# Patient Record
Sex: Female | Born: 1995 | Race: Black or African American | Hispanic: No | Marital: Single | Smoking: Never smoker
Health system: Southern US, Community
[De-identification: ages and names within clinical notes are randomized; demographics above are authoritative.]

## PROBLEM LIST (undated history)

## (undated) ENCOUNTER — Inpatient Hospital Stay (HOSPITAL_COMMUNITY): Payer: Self-pay

## (undated) DIAGNOSIS — R87629 Unspecified abnormal cytological findings in specimens from vagina: Secondary | ICD-10-CM

## (undated) DIAGNOSIS — F419 Anxiety disorder, unspecified: Secondary | ICD-10-CM

## (undated) DIAGNOSIS — N189 Chronic kidney disease, unspecified: Secondary | ICD-10-CM

## (undated) DIAGNOSIS — Z789 Other specified health status: Secondary | ICD-10-CM

## (undated) DIAGNOSIS — R0600 Dyspnea, unspecified: Secondary | ICD-10-CM

## (undated) DIAGNOSIS — E119 Type 2 diabetes mellitus without complications: Secondary | ICD-10-CM

## (undated) HISTORY — DX: Unspecified abnormal cytological findings in specimens from vagina: R87.629

## (undated) HISTORY — DX: Chronic kidney disease, unspecified: N18.9

## (undated) HISTORY — DX: Dyspnea, unspecified: R06.00

## (undated) HISTORY — PX: NO PAST SURGERIES: SHX2092

---
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
## 2015-11-13 ENCOUNTER — Encounter (HOSPITAL_COMMUNITY): Payer: Self-pay | Admitting: Emergency Medicine

## 2015-11-13 ENCOUNTER — Emergency Department (HOSPITAL_COMMUNITY)
Admission: EM | Admit: 2015-11-13 | Discharge: 2015-11-13 | Disposition: A | Discharge status: Home / Self Care | Payer: 59 | Attending: Emergency Medicine | Admitting: Emergency Medicine

## 2015-11-13 DIAGNOSIS — O2341 Unspecified infection of urinary tract in pregnancy, first trimester: Secondary | ICD-10-CM | POA: Insufficient documentation

## 2015-11-13 DIAGNOSIS — N390 Urinary tract infection, site not specified: Secondary | ICD-10-CM

## 2015-11-13 DIAGNOSIS — Z3A00 Weeks of gestation of pregnancy not specified: Secondary | ICD-10-CM | POA: Diagnosis not present

## 2015-11-13 DIAGNOSIS — Z3490 Encounter for supervision of normal pregnancy, unspecified, unspecified trimester: Secondary | ICD-10-CM

## 2015-11-13 DIAGNOSIS — R102 Pelvic and perineal pain: Secondary | ICD-10-CM | POA: Diagnosis present

## 2015-11-13 LAB — URINE MICROSCOPIC-ADD ON

## 2015-11-13 LAB — POC URINE PREG, ED: PREG TEST UR: POSITIVE — AB

## 2015-11-13 LAB — URINALYSIS, ROUTINE W REFLEX MICROSCOPIC
BILIRUBIN URINE: NEGATIVE
Glucose, UA: NEGATIVE mg/dL
Hgb urine dipstick: NEGATIVE
Ketones, ur: 40 mg/dL — AB
NITRITE: NEGATIVE
Protein, ur: NEGATIVE mg/dL
SPECIFIC GRAVITY, URINE: 1.022 (ref 1.005–1.030)
pH: 6.5 (ref 5.0–8.0)

## 2015-11-13 LAB — I-STAT CHEM 8, ED
BUN: 5 mg/dL — AB (ref 6–20)
CALCIUM ION: 1.14 mmol/L — AB (ref 1.15–1.40)
Chloride: 105 mmol/L (ref 101–111)
Creatinine, Ser: 0.7 mg/dL (ref 0.44–1.00)
Glucose, Bld: 79 mg/dL (ref 65–99)
HEMATOCRIT: 40 % (ref 36.0–46.0)
Hemoglobin: 13.6 g/dL (ref 12.0–15.0)
Potassium: 3.4 mmol/L — ABNORMAL LOW (ref 3.5–5.1)
SODIUM: 139 mmol/L (ref 135–145)
TCO2: 21 mmol/L (ref 0–100)

## 2015-11-13 MED ORDER — NITROFURANTOIN MONOHYD MACRO 100 MG PO CAPS: 100.0000 mg | ORAL_CAPSULE | Freq: Two times a day (BID) | ORAL | 0 refills | Status: DC

## 2015-11-13 MED ORDER — PRENATAL COMPLETE 14-0.4 MG PO TABS: 1.0000 | ORAL_TABLET | Freq: Two times a day (BID) | ORAL | 2 refills | Status: DC

## 2015-11-13 NOTE — ED Notes (Signed)
Patient was alert, oriented and stable upon discharge. RN went over AVS and patient had no further questions.  

## 2015-11-13 NOTE — ED Triage Notes (Signed)
Pt c/o bilateral foot and hand pain and swelling since this morning. Pt denies injury. Pt sts she did recently move and is having to go up and down stairs more. Pt girlfriend also sts "I've noticed her vagina has been swollen for about two weeks now." Pt c/o mild vaginal pain. A&Ox4 and ambulatory. Denies chest pain, SOB, dizziness.

## 2015-11-13 NOTE — ED Provider Notes (Signed)
[facility]-EMERGENCY DEPT Provider Note   CSN: [id number] Arrival date & time: 11/13/15  1536  By signing my name below, I, Vista Minkobert Ross, attest that this documentation has been prepared under the direction and in the presence of Thomas Mabry PA-C.  Electronically Signed: Vista Minkobert Ross, ED Scribe. 11/13/15. 9:00 PM.   History   Chief Complaint Chief Complaint  Patient presents with  . Foot Swelling  . hand swelling  . Groin Swelling    HPI HPI Comments: Yesenia Dawson is a 20 y.o. female who presents to the Emergency Department complaining of pain and swelling to bilateral lower extremities and bilateral hands, onset this morning. Pt states that she recently started a new job and has been on her feet a lot. She has no Hx of similar pain and swelling. Pt denies any recent injury. She also complains of mild vaginal pain and suprapubic abdominal discomfort. Pt requests a pregnancy test. She also declines pelvic exam. She denies any fever, cough, vomiting, diarrhea, vaginal discharge.   The history is provided by the patient. No language interpreter was used.    History reviewed. No pertinent past medical history.  There are no active problems to display for this patient.   History reviewed. No pertinent surgical history.  OB History    No data available       Home Medications    Prior to Admission medications   Medication Sig Start Date End Date Taking? Authorizing Provider  nitrofurantoin, macrocrystal-monohydrate, (MACROBID) 100 MG capsule Take 1 capsule (100 mg total) by mouth 2 (two) times daily. 11/13/15   Marlon Peliffany Kandee Escalante, PA-C  Prenatal Vit-Fe Fumarate-FA (PRENATAL COMPLETE) 14-0.4 MG TABS Take 1 tablet by mouth 2 (two) times daily. 11/13/15   Marlon Peliffany Ersel Enslin, PA-C    Family History Family History  Problem Relation Age of Onset  . Hypertension Mother   . Hypertension Father   . Cancer Other   . Diabetes Other     Social History Social History  Substance Use Topics    . Smoking status: Never Smoker  . Smokeless tobacco: Never Used  . Alcohol use Yes     Allergies   Review of patient's allergies indicates no known allergies.   Review of Systems Review of Systems  Constitutional: Negative for fever.  Respiratory: Negative for cough.   Gastrointestinal: Positive for abdominal pain (suprapubic). Negative for diarrhea, nausea and vomiting.  Genitourinary: Negative for vaginal discharge.  Musculoskeletal: Positive for arthralgias (bilateral upper and lower extremities).  All other systems reviewed and are negative.    Physical Exam Updated Vital Signs BP 119/79   Pulse 89   Temp 98.2 F (36.8 C) (Oral)   Resp 16   LMP 09/26/2015   SpO2 99%   Physical Exam  Constitutional: She is oriented to person, place, and time. She appears well-developed and well-nourished. No distress.  HENT:  Head: Normocephalic and atraumatic.  Neck: Normal range of motion.  Pulmonary/Chest: Effort normal and breath sounds normal. She has no wheezes. She has no rales.  Abdominal: She exhibits no distension and no mass. There is tenderness. There is no rebound and no guarding. No hernia.  Mild suprapubic tenderness  Genitourinary:  Genitourinary Comments: Pt declined pelvic exam.  Musculoskeletal:  No objective signs of swelling to bilateral upper and lower extremities.  Neurological: She is alert and oriented to person, place, and time.  Skin: Skin is warm and dry. She is not diaphoretic.  Psychiatric: She has a normal mood and affect. Judgment normal.  Nursing note and vitals reviewed.   ED Treatments / Results  DIAGNOSTIC STUDIES: Oxygen Saturation is 99% on RA, normal by my interpretation.  COORDINATION OF CARE: 8:57 PM-Will order blood work. Discussed treatment plan with pt at bedside and pt agreed to plan.   Labs (all labs ordered are listed, but only abnormal results are displayed) Labs Reviewed  URINALYSIS, ROUTINE W REFLEX MICROSCOPIC (NOT AT  [facility] - Abnormal; Notable for the following:       Result Value   APPearance CLOUDY (*)    Ketones, ur 40 (*)    Leukocytes, UA MODERATE (*)    All other components within normal limits  URINE MICROSCOPIC-ADD ON - Abnormal; Notable for the following:    Squamous Epithelial / LPF 6-30 (*)    Bacteria, UA FEW (*)    All other components within normal limits  I-STAT CHEM 8, ED - Abnormal; Notable for the following:    Potassium 3.4 (*)    BUN 5 (*)    Calcium, Ion 1.14 (*)    All other components within normal limits  POC URINE PREG, ED - Abnormal; Notable for the following:    Preg Test, Ur POSITIVE (*)    All other components within normal limits  URINE CULTURE    EKG  EKG Interpretation None       Radiology No results found.  Procedures Procedures (including critical care time)  Medications Ordered in ED Medications - No data to display   Initial Impression / Assessment and Plan / ED Course  I have reviewed the triage vital signs and the nursing notes.  Pertinent labs & imaging results that were available during my care of the patient were reviewed by me and considered in my medical decision making (see chart for details).  Clinical Course    Patient has positive pregnancy test, based on missing only one menstrual cycle she is likely early in the pregnancy. Will start her on prenatals and refer to womens. UTI, urine culture sent out and started on Macrobid.  Medications - No data to display  I discussed results, diagnoses and plan with Yesenia Dawson. They voice there understanding and questions were answered. We discussed follow-up recommendations and return precautions.   Final Clinical Impressions(s) / ED Diagnoses   Final diagnoses:  Pregnant  UTI (lower urinary tract infection)    New Prescriptions New Prescriptions   NITROFURANTOIN, MACROCRYSTAL-MONOHYDRATE, (MACROBID) 100 MG CAPSULE    Take 1 capsule (100 mg total) by mouth 2 (two) times daily.    PRENATAL VIT-FE FUMARATE-FA (PRENATAL COMPLETE) 14-0.4 MG TABS    Take 1 tablet by mouth 2 (two) times daily.    I personally performed the services described in this documentation, which was scribed in my presence. The recorded information has been reviewed and is accurate.    [name], PA-C 11/13/15 4782    Mancel Bale, MD 11/14/15 [phone]

## 2015-11-15 LAB — URINE CULTURE

## 2015-12-13 DIAGNOSIS — Z3490 Encounter for supervision of normal pregnancy, unspecified, unspecified trimester: Secondary | ICD-10-CM | POA: Insufficient documentation

## 2015-12-13 DIAGNOSIS — Z148 Genetic carrier of other disease: Secondary | ICD-10-CM | POA: Insufficient documentation

## 2015-12-13 LAB — OB RESULTS CONSOLE HIV ANTIBODY (ROUTINE TESTING): HIV: NONREACTIVE

## 2015-12-13 LAB — OB RESULTS CONSOLE ABO/RH: RH Type: POSITIVE

## 2015-12-13 LAB — OB RESULTS CONSOLE RUBELLA ANTIBODY, IGM: RUBELLA: IMMUNE

## 2015-12-13 LAB — OB RESULTS CONSOLE GC/CHLAMYDIA
Chlamydia: POSITIVE
GC PROBE AMP, GENITAL: NEGATIVE

## 2015-12-13 LAB — OB RESULTS CONSOLE HEPATITIS B SURFACE ANTIGEN: HEP B S AG: NEGATIVE

## 2015-12-13 LAB — OB RESULTS CONSOLE RPR: RPR: NONREACTIVE

## 2015-12-13 LAB — OB RESULTS CONSOLE ANTIBODY SCREEN: Antibody Screen: NEGATIVE

## 2016-01-10 DIAGNOSIS — O99210 Obesity complicating pregnancy, unspecified trimester: Secondary | ICD-10-CM | POA: Insufficient documentation

## 2016-02-26 NOTE — L&D Delivery Note (Signed)
Delivery Note At 12:02 PM a viable female was delivered via Vaginal, Spontaneous Delivery (Presentation: vtx; ROP ).  APGAR: pending; weight pending.   Placenta status: spontaneous, intact, clots at edges.  Cord:  with the following complications: none.  Cord pH: pending  Anesthesia:  Epidural Episiotomy: None Lacerations: None Suture Repair: none Est. Blood Loss (mL):  300  Mom to postpartum.  Baby to NICU.  Wendal Wilkie D 05/08/2016, 12:15 PM

## 2016-04-07 ENCOUNTER — Emergency Department (HOSPITAL_COMMUNITY)
Admission: EM | Admit: 2016-04-07 | Discharge: 2016-04-07 | Disposition: A | Discharge status: Home / Self Care | Payer: 59 | Attending: Emergency Medicine | Admitting: Emergency Medicine

## 2016-04-07 ENCOUNTER — Emergency Department (HOSPITAL_COMMUNITY): Payer: 59

## 2016-04-07 ENCOUNTER — Encounter (HOSPITAL_COMMUNITY): Payer: Self-pay | Admitting: *Deleted

## 2016-04-07 DIAGNOSIS — Y929 Unspecified place or not applicable: Secondary | ICD-10-CM | POA: Insufficient documentation

## 2016-04-07 DIAGNOSIS — O9A212 Injury, poisoning and certain other consequences of external causes complicating pregnancy, second trimester: Secondary | ICD-10-CM | POA: Insufficient documentation

## 2016-04-07 DIAGNOSIS — Z79899 Other long term (current) drug therapy: Secondary | ICD-10-CM | POA: Diagnosis not present

## 2016-04-07 DIAGNOSIS — S8392XA Sprain of unspecified site of left knee, initial encounter: Secondary | ICD-10-CM | POA: Insufficient documentation

## 2016-04-07 DIAGNOSIS — Z3A25 25 weeks gestation of pregnancy: Secondary | ICD-10-CM | POA: Insufficient documentation

## 2016-04-07 DIAGNOSIS — Y939 Activity, unspecified: Secondary | ICD-10-CM | POA: Diagnosis not present

## 2016-04-07 DIAGNOSIS — Y999 Unspecified external cause status: Secondary | ICD-10-CM | POA: Diagnosis not present

## 2016-04-07 DIAGNOSIS — W010XXA Fall on same level from slipping, tripping and stumbling without subsequent striking against object, initial encounter: Secondary | ICD-10-CM | POA: Insufficient documentation

## 2016-04-07 MED ORDER — ACETAMINOPHEN 500 MG PO TABS
1000.0000 mg | ORAL_TABLET | Freq: Once | ORAL | Status: AC
  Administered 2016-04-07: 1000 mg via ORAL
  Filled 2016-04-07: qty 2

## 2016-04-07 MED ORDER — CALCIUM CARBONATE ANTACID 500 MG PO CHEW
2.0000 | CHEWABLE_TABLET | Freq: Once | ORAL | Status: AC
  Administered 2016-04-07: 400 mg via ORAL
  Filled 2016-04-07: qty 2

## 2016-04-07 NOTE — ED Notes (Signed)
Bed: [id number] Expected date:  Expected time:  Means of arrival:  Comments: tri1

## 2016-04-07 NOTE — ED Provider Notes (Signed)
[facility]EMERGENCY DEPT Provider Note   CSN: [id number] Arrival date & time: 04/07/16 1837     History    Chief Complaint  Patient presents with  . Fall     HPI Summar Yesenia Dawson is a 21 y.o. female.  21yo F [redacted] weeks pregnant who p/w L knee Pain after a fall. 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 She felt a pop sensation in her left knee and had an immediate onset of moderate to severe, constant left knee pain. She has been able to walk but with a limp. No abdominal trauma or pain, vaginal bleeding/fluid, or other complaints.   History reviewed. No pertinent past medical history.   There are no active problems to display for this patient.   History reviewed. No pertinent surgical history.  OB History    Gravida Para Term Preterm AB Living   1             SAB TAB Ectopic Multiple Live Births                    Home Medications    Prior to Admission medications   Medication Sig Start Date End Date Taking? Authorizing Provider  Prenatal Vit-Fe Fumarate-FA (PRENATAL COMPLETE) 14-0.4 MG TABS Take 1 tablet by mouth 2 (two) times daily. 11/13/15  Yes Tiffany Neva Seat, PA-C  nitrofurantoin, macrocrystal-monohydrate, (MACROBID) 100 MG capsule Take 1 capsule (100 mg total) by mouth 2 (two) times daily. Patient not taking: Reported on 04/07/2016 11/13/15   [name], PA-C      Family History  Problem Relation Age of Onset  . Hypertension Mother   . Hypertension Father   . Cancer Other   . Diabetes Other      Social History  Substance Use Topics  . Smoking status: Never Smoker  . Smokeless tobacco: Never Used  . Alcohol use Yes     Allergies     Patient has no known allergies.    Review of Systems  10 Systems reviewed and are negative for acute change except as noted in the HPI.   Physical Exam Updated Vital Signs BP 132/86 (BP Location: Left Arm)   Pulse 98   Temp 98.4 F  (36.9 C)   Resp 18   Ht 5' (1.524 m)   Wt 230 lb (104.3 kg)   LMP 09/26/2015   SpO2 100%   BMI 44.92 kg/m   Physical Exam  Constitutional: She is oriented to person, place, and time. She appears well-developed and well-nourished. No distress.  HENT:  Head: Normocephalic and atraumatic.  Eyes: Conjunctivae are normal.  Neck: Neck supple.  Cardiovascular: Intact distal pulses.   Musculoskeletal: Normal range of motion. She exhibits tenderness.  Tenderness of anterior L knee over patella w/ no obvious deformity; no joint laxity, negative ant/post drawer sign; pain w/ flexion of knee  Neurological: She is alert and oriented to person, place, and time. No sensory deficit.  Skin: Skin is warm and dry.  Psychiatric: She has a normal mood and affect. Judgment normal.  Nursing note and vitals reviewed.     ED Treatments / Results  Labs (all labs ordered are listed, but only abnormal results are displayed) Labs Reviewed - No data to display   EKG  EKG Interpretation  Date/Time:    Ventricular Rate:    PR Interval:    QRS Duration:   QT Interval:  QTC Calculation:   R Axis:     Text Interpretation:           Radiology Dg Knee Complete 4 Views Left  Result Date: 04/07/2016 CLINICAL DATA:  Slip and fall with left knee pain, initial encounter EXAM: LEFT KNEE - COMPLETE 4+ VIEW COMPARISON:  None. FINDINGS: No evidence of fracture, dislocation, or joint effusion. No evidence of arthropathy or other focal bone abnormality. Soft tissues are unremarkable. IMPRESSION: No acute abnormality noted. Electronically Signed   By: Alcide CleverMark  Lukens M.D.   On: 04/07/2016 19:58    Procedures Procedures (including critical care time) Procedures  Medications Ordered in ED  Medications  acetaminophen (TYLENOL) tablet 1,000 mg (1,000 mg Oral Given 04/07/16 2013)  calcium carbonate (TUMS - dosed in mg elemental calcium) chewable tablet 400 mg of elemental calcium (400 mg of elemental calcium  Oral Given 04/07/16 2029)     Initial Impression / Assessment and Plan / ED Course  I have reviewed the triage vital signs and the nursing notes.  Pertinent imaging results that were available during my care of the patient were reviewed by me and considered in my medical decision making (see chart for details).      Pt w/ L knee pain after fall. No joint laxity on exam, neurovascularly intact. Plain films negative acute. She does report pain with ambulation and hearing a pop, therefore it is possible she may have ligamentous injury. Placed in the immobilizer with crutches and provided with orthopedics follow-up information. She has no pregnancy related complaints therefore stable for discharge. Discussed supportive care including ice, elevation, and Tylenol only. Patient voiced understanding was discharged in satisfactory condition.  Final Clinical Impressions(s) / ED Diagnoses   Final diagnoses:  Sprain of left knee, unspecified ligament, initial encounter     Discharge Medication List as of 04/07/2016  8:13 PM         Laurence Spatesachel Morgan Kashia Brossard, MD 04/08/16 [phone]

## 2016-04-07 NOTE — ED Notes (Signed)
Patient is 6 months pregnant. Denies any abdominal pain or potential harm to baby boy. Denies any feelings of contractions.

## 2016-04-07 NOTE — ED Triage Notes (Signed)
Pt arrives via EMS with c/o fall when she slipped and fell on the floor, landed onto left side. C/o left knee pain. Denies further pain. 138/92 bp

## 2016-04-07 NOTE — ED Notes (Signed)
Patient transported to X-ray 

## 2016-04-20 ENCOUNTER — Encounter (HOSPITAL_COMMUNITY): Payer: Self-pay

## 2016-04-20 ENCOUNTER — Inpatient Hospital Stay (HOSPITAL_COMMUNITY)
Admission: AD | Admit: 2016-04-20 | Discharge: 2016-04-20 | Disposition: A | Discharge status: Home / Self Care | Payer: 59 | Source: Ambulatory Visit | Attending: Obstetrics and Gynecology | Admitting: Obstetrics and Gynecology

## 2016-04-20 DIAGNOSIS — O26812 Pregnancy related exhaustion and fatigue, second trimester: Secondary | ICD-10-CM

## 2016-04-20 DIAGNOSIS — Z3A26 26 weeks gestation of pregnancy: Secondary | ICD-10-CM | POA: Diagnosis not present

## 2016-04-20 DIAGNOSIS — O26892 Other specified pregnancy related conditions, second trimester: Secondary | ICD-10-CM | POA: Diagnosis not present

## 2016-04-20 DIAGNOSIS — O9989 Other specified diseases and conditions complicating pregnancy, childbirth and the puerperium: Secondary | ICD-10-CM | POA: Diagnosis not present

## 2016-04-20 DIAGNOSIS — Z711 Person with feared health complaint in whom no diagnosis is made: Secondary | ICD-10-CM

## 2016-04-20 DIAGNOSIS — R8271 Bacteriuria: Secondary | ICD-10-CM | POA: Diagnosis not present

## 2016-04-20 DIAGNOSIS — O26813 Pregnancy related exhaustion and fatigue, third trimester: Secondary | ICD-10-CM

## 2016-04-20 DIAGNOSIS — O99891 Other specified diseases and conditions complicating pregnancy: Secondary | ICD-10-CM

## 2016-04-20 LAB — CBC
HEMATOCRIT: 33.7 % — AB (ref 36.0–46.0)
HEMOGLOBIN: 11.7 g/dL — AB (ref 12.0–15.0)
MCH: 29.3 pg (ref 26.0–34.0)
MCHC: 34.7 g/dL (ref 30.0–36.0)
MCV: 84.3 fL (ref 78.0–100.0)
Platelets: 276 10*3/uL (ref 150–400)
RBC: 4 MIL/uL (ref 3.87–5.11)
RDW: 14.3 % (ref 11.5–15.5)
WBC: 13.8 10*3/uL — ABNORMAL HIGH (ref 4.0–10.5)

## 2016-04-20 LAB — URINALYSIS, ROUTINE W REFLEX MICROSCOPIC
Bilirubin Urine: NEGATIVE
GLUCOSE, UA: NEGATIVE mg/dL
Hgb urine dipstick: NEGATIVE
KETONES UR: NEGATIVE mg/dL
Nitrite: NEGATIVE
PH: 7 (ref 5.0–8.0)
Protein, ur: NEGATIVE mg/dL
Specific Gravity, Urine: 1.017 (ref 1.005–1.030)

## 2016-04-20 MED ORDER — NITROFURANTOIN MONOHYD MACRO 100 MG PO CAPS: 100.0000 mg | ORAL_CAPSULE | Freq: Two times a day (BID) | ORAL | 0 refills | Status: DC

## 2016-04-20 NOTE — Discharge Instructions (Signed)
Pregnancy and Urinary Tract Infection °WHAT IS A URINARY TRACT INFECTION? °A urinary tract infection (UTI) is an infection of any part of the urinary tract. This includes the kidneys, the tubes that connect your kidneys to your bladder (ureters), the bladder, and the tube that carries urine out of your body (urethra). These organs make, store, and get rid of urine in the body. A UTI can be a bladder infection (cystitis) or a kidney infection (pyelonephritis). This infection may be caused by fungi, viruses, and bacteria. Bacteria are the most common cause of UTIs. °You are more likely to develop a UTI during pregnancy because: °· The physical and hormonal changes your body goes through can make it easier for bacteria to get into your urinary tract. °· Your growing baby puts pressure on your uterus and can affect urine flow. °DOES A UTI PLACE MY BABY AT RISK? °An untreated UTI during pregnancy could lead to a kidney infection, which can cause health problems that could affect your baby. Possible complications of an untreated UTI include: °· Having your baby before 37 weeks of pregnancy (premature). °· Having a baby with a low birth weight. °· Developing high blood pressure during pregnancy (preeclampsia). °WHAT ARE THE SYMPTOMS OF A UTI? °Symptoms of a UTI include: °· Fever. °· Frequent urination or passing small amounts of urine frequently. °· Needing to urinate urgently. °· Pain or a burning sensation with urination. °· Urine that smells bad or unusual. °· Cloudy urine. °· Pain in the lower abdomen or back. °· Trouble urinating. °· Blood in the urine. °· Vomiting or being less hungry than normal. °· Diarrhea or abdominal pain. °· Vaginal discharge. °WHAT ARE THE TREATMENT OPTIONS FOR A UTI DURING PREGNANCY? °Treatment for this condition may include: °· Antibiotic medicines that are safe to take during pregnancy. °· Other medicines to treat less common causes of UTI. °HOW CAN I PREVENT A UTI? °To prevent a UTI: °· Go  to the bathroom as soon as you feel the need. °· Always wipe from front to back. °· Wash your genital area with soap and warm water daily. °· Empty your bladder before and after sex. °· Wear cotton underwear. °· Limit your intake of high sugar foods or drinks, such as regular soda, juice, and sweets.. °· Drink 6-8 glasses of water daily. °· Do not wear tight-fitting pants. °· Do not douche or use deodorant sprays. °· Do not drink alcohol, caffeine, or carbonated drinks. These can irritate the bladder. °WHEN SHOULD I SEEK MEDICAL CARE? °Seek medical care if: °· Your symptoms do not improve or get worse. °· You have a fever after two days of treatment. °· You have a rash. °· You have abnormal vaginal discharge. °· You have back or side pain. °· You have chills. °· You have nausea and vomiting. °WHEN SHOULD I SEEK IMMEDIATE MEDICAL CARE? °Seek immediate medical care if you are pregnant and: °· You feel contractions in your uterus. °· You have lower belly pain. °· You have a gush of fluid from your vagina. °· You have blood in your urine. °· You are vomiting and cannot keep down any medicines or water. °This information is not intended to replace advice given to you by your health care provider. Make sure you discuss any questions you have with your health care provider. °Document Released: 06/08/2010 Document Revised: 07/17/2015 Document Reviewed: 01/02/2015 °Elsevier Interactive Patient Education © 2017 [employer] ° °

## 2016-04-20 NOTE — MAU Note (Signed)
Pt has been feeling weak and out of breath. No bleeding or LOF.

## 2016-04-20 NOTE — MAU Provider Note (Signed)
History     CSN: [id number]  Arrival date and time: 04/20/16 1818   First Provider Initiated Contact with Patient 04/20/16 1857      Chief Complaint  Patient presents with  . Fatigue   HPI   Yesenia Dawson is 21 y.o. female G1P0 @ 4023w6d female here in MAU with fatigue.  She was upset with her boss today at work. 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 The patient got upset and started crying. When she gets upset she starts breathing really fast and feels weak after. She thinks she is becoming anxious at times.   OB History    Gravida Para Term Preterm AB Living   1             SAB TAB Ectopic Multiple Live Births                  No past medical history on file.  No past surgical history on file.  Family History  Problem Relation Age of Onset  . Hypertension Mother   . Hypertension Father   . Cancer Other   . Diabetes Other     Social History  Substance Use Topics  . Smoking status: Never Smoker  . Smokeless tobacco: Never Used  . Alcohol use Yes    Allergies: No Known Allergies  Prescriptions Prior to Admission  Medication Sig Dispense Refill Last Dose  . nitrofurantoin, macrocrystal-monohydrate, (MACROBID) 100 MG capsule Take 1 capsule (100 mg total) by mouth 2 (two) times daily. (Patient not taking: Reported on 04/07/2016) 10 capsule 0 Completed Course at Unknown time  . Prenatal Vit-Fe Fumarate-FA (PRENATAL COMPLETE) 14-0.4 MG TABS Take 1 tablet by mouth 2 (two) times daily. 60 each 2 04/07/2016 at Unknown time   Results for orders placed or performed during the hospital encounter of 04/20/16 (from the past 48 hour(s))  Urinalysis, Routine w reflex microscopic     Status: Abnormal   Collection Time: 04/20/16  6:18 PM  Result Value Ref Range   Color, Urine YELLOW YELLOW   APPearance HAZY (A) CLEAR   Specific Gravity, Urine 1.017 1.005 - 1.030   pH 7.0 5.0 -  8.0   Glucose, UA NEGATIVE NEGATIVE mg/dL   Hgb urine dipstick NEGATIVE NEGATIVE   Bilirubin Urine NEGATIVE NEGATIVE   Ketones, ur NEGATIVE NEGATIVE mg/dL   Protein, ur NEGATIVE NEGATIVE mg/dL   Nitrite NEGATIVE NEGATIVE   Leukocytes, UA MODERATE (A) NEGATIVE   RBC / HPF 6-30 0 - 5 RBC/hpf   WBC, UA TOO NUMEROUS TO COUNT 0 - 5 WBC/hpf   Bacteria, UA RARE (A) NONE SEEN   Squamous Epithelial / LPF 0-5 (A) NONE SEEN   Mucous PRESENT   CBC     Status: Abnormal   Collection Time: 04/20/16  7:07 PM  Result Value Ref Range   WBC 13.8 (H) 4.0 - 10.5 K/uL   RBC 4.00 3.87 - 5.11 MIL/uL   Hemoglobin 11.7 (L) 12.0 - 15.0 g/dL   HCT 57.833.7 (L) 46.936.0 - 62.946.0 %   MCV 84.3 78.0 - 100.0 fL   MCH 29.3 26.0 - 34.0 pg   MCHC 34.7 30.0 - 36.0 g/dL   RDW 52.814.3 41.311.5 - 24.415.5 %   Platelets 276 150 - 400 K/uL   Review of Systems  Constitutional: Positive for fatigue.  Genitourinary: Negative for dysuria.  Musculoskeletal: Negative for  back pain.  Neurological: Positive for weakness.   Physical Exam   Blood pressure 119/86, pulse 118, temperature 98.7 F (37.1 C), resp. rate 18, last menstrual period 09/26/2015, SpO2 100 %.  Physical Exam  Constitutional: She is oriented to person, place, and time. She appears well-developed and well-nourished. No distress.  HENT:  Head: Normocephalic.  Eyes: Pupils are equal, round, and reactive to light.  Cardiovascular: Normal rate and normal heart sounds.   Respiratory: Effort normal and breath sounds normal.  Musculoskeletal: Normal range of motion.  Neurological: She is alert and oriented to person, place, and time.  Skin: Skin is warm. She is not diaphoretic.  Psychiatric: Her behavior is normal.   Fetal Tracing: Baseline: 140 bpm Variability: Moderate  Accelerations: 10x10 Decelerations: none Toco: none  MAU Course  Procedures  None  MDM  CBC UA Urine culture NST Discussed patient with Dr. Senaida Ores. Patient is stable at this time. Ok for  discharge.   Assessment and Plan   A:  1. Physically well but worried   2. Fatigue during pregnancy in second trimester   3. Asymptomatic bacteriuria during pregnancy     P:  Discharge home in stable condition Rx: Macrobid Return to MAU for emergencies only Follow up with OB Discussed breathing techniques, avoid triggers.   Duane Lope, NP 04/20/2016 8:01 PM

## 2016-04-22 LAB — CULTURE, OB URINE

## 2016-05-05 ENCOUNTER — Inpatient Hospital Stay (HOSPITAL_COMMUNITY): Payer: 59

## 2016-05-05 ENCOUNTER — Inpatient Hospital Stay (HOSPITAL_COMMUNITY)
Admission: AD | Admit: 2016-05-05 | Discharge: 2016-05-10 | DRG: 774 | Disposition: A | Discharge status: Home / Self Care | Payer: 59 | Source: Ambulatory Visit | Attending: Obstetrics and Gynecology | Admitting: Obstetrics and Gynecology

## 2016-05-05 ENCOUNTER — Encounter (HOSPITAL_COMMUNITY): Payer: Self-pay

## 2016-05-05 DIAGNOSIS — Z6841 Body Mass Index (BMI) 40.0 and over, adult: Secondary | ICD-10-CM

## 2016-05-05 DIAGNOSIS — O26873 Cervical shortening, third trimester: Secondary | ICD-10-CM | POA: Diagnosis present

## 2016-05-05 DIAGNOSIS — O42013 Preterm premature rupture of membranes, onset of labor within 24 hours of rupture, third trimester: Principal | ICD-10-CM | POA: Diagnosis present

## 2016-05-05 DIAGNOSIS — Z3A29 29 weeks gestation of pregnancy: Secondary | ICD-10-CM

## 2016-05-05 DIAGNOSIS — Z8249 Family history of ischemic heart disease and other diseases of the circulatory system: Secondary | ICD-10-CM

## 2016-05-05 DIAGNOSIS — O4703 False labor before 37 completed weeks of gestation, third trimester: Secondary | ICD-10-CM

## 2016-05-05 DIAGNOSIS — O99214 Obesity complicating childbirth: Secondary | ICD-10-CM | POA: Diagnosis present

## 2016-05-05 DIAGNOSIS — B9689 Other specified bacterial agents as the cause of diseases classified elsewhere: Secondary | ICD-10-CM | POA: Diagnosis present

## 2016-05-05 DIAGNOSIS — Z833 Family history of diabetes mellitus: Secondary | ICD-10-CM

## 2016-05-05 HISTORY — DX: Other specified health status: Z78.9

## 2016-05-05 LAB — CBC
HEMATOCRIT: 38.1 % (ref 36.0–46.0)
HEMOGLOBIN: 12.9 g/dL (ref 12.0–15.0)
MCH: 29.3 pg (ref 26.0–34.0)
MCHC: 33.9 g/dL (ref 30.0–36.0)
MCV: 86.6 fL (ref 78.0–100.0)
Platelets: 319 10*3/uL (ref 150–400)
RBC: 4.4 MIL/uL (ref 3.87–5.11)
RDW: 14.5 % (ref 11.5–15.5)
WBC: 16.3 10*3/uL — ABNORMAL HIGH (ref 4.0–10.5)

## 2016-05-05 LAB — URINALYSIS, ROUTINE W REFLEX MICROSCOPIC
BILIRUBIN URINE: NEGATIVE
Glucose, UA: NEGATIVE mg/dL
Hgb urine dipstick: NEGATIVE
KETONES UR: NEGATIVE mg/dL
NITRITE: NEGATIVE
PH: 7 (ref 5.0–8.0)
Protein, ur: NEGATIVE mg/dL
SPECIFIC GRAVITY, URINE: 1.014 (ref 1.005–1.030)

## 2016-05-05 LAB — WET PREP, GENITAL
SPERM: NONE SEEN
Trich, Wet Prep: NONE SEEN

## 2016-05-05 LAB — TYPE AND SCREEN
ABO/RH(D): B POS
Antibody Screen: NEGATIVE

## 2016-05-05 LAB — AMNISURE RUPTURE OF MEMBRANE (ROM) NOT AT ARMC: Amnisure ROM: NEGATIVE

## 2016-05-05 LAB — FETAL FIBRONECTIN: FETAL FIBRONECTIN: POSITIVE — AB

## 2016-05-05 LAB — ABO/RH: ABO/RH(D): B POS

## 2016-05-05 MED ORDER — BETAMETHASONE SOD PHOS & ACET 6 (3-3) MG/ML IJ SUSP: 12.0000 mg | INTRAMUSCULAR | Status: DC

## 2016-05-05 MED ORDER — MAGNESIUM SULFATE 4 GM/100ML IV SOLN
4.0000 g | Freq: Once | INTRAVENOUS | Status: DC
Start: 2016-05-05 — End: 2016-05-05

## 2016-05-05 MED ORDER — BETAMETHASONE SOD PHOS & ACET 6 (3-3) MG/ML IJ SUSP
12.0000 mg | INTRAMUSCULAR | Status: AC
  Administered 2016-05-05 – 2016-05-06 (×2): 12 mg via INTRAMUSCULAR
  Filled 2016-05-05 (×2): qty 2

## 2016-05-05 MED ORDER — PRENATAL MULTIVITAMIN CH
1.0000 | ORAL_TABLET | Freq: Every day | ORAL | Status: DC
  Administered 2016-05-06: 1 via ORAL
  Filled 2016-05-05 (×2): qty 1

## 2016-05-05 MED ORDER — MAGNESIUM SULFATE BOLUS VIA INFUSION
2.0000 g | Freq: Once | INTRAVENOUS | Status: DC
  Filled 2016-05-05: qty 500

## 2016-05-05 MED ORDER — MAGNESIUM SULFATE BOLUS VIA INFUSION
4.0000 g | Freq: Once | INTRAVENOUS | Status: DC
Start: 2016-05-05 — End: 2016-05-07
  Filled 2016-05-05: qty 500

## 2016-05-05 MED ORDER — LACTATED RINGERS IV SOLN
INTRAVENOUS | Status: DC
  Administered 2016-05-05: 18:00:00 via INTRAVENOUS

## 2016-05-05 MED ORDER — DOCUSATE SODIUM 100 MG PO CAPS
100.0000 mg | ORAL_CAPSULE | Freq: Every day | ORAL | Status: DC
  Administered 2016-05-07: 100 mg via ORAL
  Filled 2016-05-05 (×3): qty 1

## 2016-05-05 MED ORDER — MAGNESIUM SULFATE 50 % IJ SOLN
3.0000 g/h | INTRAVENOUS | Status: DC
  Administered 2016-05-05 – 2016-05-06 (×2): 3 g/h via INTRAVENOUS
  Filled 2016-05-05 (×2): qty 80

## 2016-05-05 MED ORDER — ZOLPIDEM TARTRATE 5 MG PO TABS: 5.0000 mg | ORAL_TABLET | Freq: Every evening | ORAL | Status: DC | PRN

## 2016-05-05 MED ORDER — MAGNESIUM SULFATE 50 % IJ SOLN
3.0000 g/h | Freq: Once | INTRAVENOUS | Status: AC
  Administered 2016-05-05: 3 g/h via INTRAVENOUS
  Filled 2016-05-05: qty 80

## 2016-05-05 MED ORDER — CALCIUM CARBONATE ANTACID 500 MG PO CHEW
2.0000 | CHEWABLE_TABLET | ORAL | Status: DC | PRN
  Administered 2016-05-06: 400 mg via ORAL
  Filled 2016-05-05: qty 2

## 2016-05-05 MED ORDER — MAGNESIUM SULFATE 50 % IJ SOLN
2.0000 g/h | Freq: Once | INTRAVENOUS | Status: AC
  Administered 2016-05-05: 2 g/h via INTRAVENOUS
  Filled 2016-05-05: qty 80

## 2016-05-05 MED ORDER — LACTATED RINGERS IV SOLN
INTRAVENOUS | Status: DC
  Administered 2016-05-05 – 2016-05-07 (×3): via INTRAVENOUS

## 2016-05-05 MED ORDER — ACETAMINOPHEN 325 MG PO TABS: 650.0000 mg | ORAL_TABLET | ORAL | Status: DC | PRN

## 2016-05-05 NOTE — MAU Note (Signed)
Having bad pelvic pains, started last night when she was at work.  Denies PTL. Hx.  No bleeding or leaking.  Having a mucous d/c

## 2016-05-05 NOTE — Progress Notes (Signed)
Dr. Ellyn HackBovard notified .Patient should continue on  3 grams of mag   till the morning then 2 grams per Dr. Ellyn HackBovard.

## 2016-05-05 NOTE — MAU Provider Note (Signed)
  History     CSN: [id number]  Arrival date and time: 05/05/16 1615   First Provider Initiated Contact with Patient 05/05/16 1717      Chief Complaint  Patient presents with  . Pelvic Pain   HPI Yesenia Dawson 20 y.o. 5759w0d  Came to MAU with contractions that she has been having since last night.  Also has back pain with the contractions that is different from other times in her pregnancy.   OB History    Gravida Para Term Preterm AB Living   1             SAB TAB Ectopic Multiple Live Births                  Past Medical History:  Diagnosis Date  . Medical history non-contributory     Past Surgical History:  Procedure Laterality Date  . NO PAST SURGERIES      Family History  Problem Relation Age of Onset  . Hypertension Mother   . Hypertension Father   . Cancer Other   . Diabetes Other     Social History  Substance Use Topics  . Smoking status: Never Smoker  . Smokeless tobacco: Never Used  . Alcohol use Yes    Allergies: No Known Allergies  Prescriptions Prior to Admission  Medication Sig Dispense Refill Last Dose  . Prenatal Vit-Fe Phos-FA-Omega (VITAFOL GUMMIES) 3.33-0.333-34.8 MG CHEW Chew 3 each by mouth daily.   05/05/2016 at Unknown time    Review of Systems  Constitutional: Negative for fever.  Gastrointestinal: Positive for abdominal pain. Negative for nausea and vomiting.       Contractions that are becoming more frequent.  Genitourinary: Negative for vaginal bleeding and vaginal discharge.  Musculoskeletal: Positive for back pain.   Physical Exam   Blood pressure 112/76, pulse 106, temperature 98.4 F (36.9 C), temperature source Oral, resp. rate 20, height 5' (1.524 m), weight 250 lb (113.4 kg), last menstrual period 09/26/2015, SpO2 99 %.  Physical Exam  Nursing note and vitals reviewed. Constitutional: She is oriented to person, place, and time. She appears well-developed and well-nourished.  HENT:  Head: Normocephalic.  Eyes: EOM  are normal.  Neck: Neck supple.  GI: Soft. There is tenderness.  On monitor - FHT baseline 150.  Contractions at 5-7 minutes.  Genitourinary:  Genitourinary Comments: FFN done Speculum exam: Vagina - Mod amount of yellow discharge, no odor Cervix - No contact bleeding Bimanual exam: Cervix 1-2, 60%, extremely soft GC/Chlam, wet prep done Chaperone present for exam.   Musculoskeletal: Normal range of motion.  Neurological: She is alert and oriented to person, place, and time.  Skin: Skin is warm and dry.  Psychiatric: She has a normal mood and affect.    MAU Course  Procedures FFN, GC, Chlam, wet prep done  MDM 1730  Dr. Ellyn HackBovard notified of client's condition - orders given and she will be in to see the patient.  Will start IVF and give betamethasone.  Assessment and Plan  Preterm labor and will likely be admitted for MGSO4  Yesenia Dawson 05/05/2016, 5:29 PM

## 2016-05-05 NOTE — Consult Note (Signed)
Neonatology Consult Note:  At the request of the patients obstetrician Dr. Melba Coon I met with Yesenia Dawson who is a 21 y.o. female G1P0 at 30 weeks with preterm labor and PPROM. She is being admitted for IVF, betamethasone, magnesium sulfate and observation.  Pregnancy complicated by short cervix, h/o positive chlamydia with negative TOC, maternal obesity h/o HTN - improved with weight loss and pre-DM.       We discussed morbidity/mortality at this gestional age, delivery room resuscitation, including intubation and surfactant in DR.  Discussed mechanical ventilation and risk for chronic lung disease, risk for IVH with potential for motor / cognitive deficits, ROP, NEC, sepsis, as well as temperature instability and feeding immaturity.  Discussed NG / OG feeds, benefits of MBM in reducing incidence of NEC.   Discussed likely length of stay. Thank you for allowing [place] to participate in her care.  Please call with questions.  Higinio Roger, DO  Neonatologist  The total length of face-to-face or floor / unit time for this encounter was 20 minutes.  Counseling and / or coordination of care was greater than fifty percent of the time.

## 2016-05-05 NOTE — Progress Notes (Signed)
Dr Ellyn HackBovard notified of pt's magnesium sulfate iv bolus completed. Pt continuing to c/o contractions that are worsening and feel closer. Dr Ellyn HackBovard requests for RN to do SVE and send pt to L&D.

## 2016-05-05 NOTE — H&P (Addendum)
Yesenia Dawson is a 21 y.o. female G1P0 at 29week with contractions/pressure since last night.  Called overnight and they were 10-20 minutes apart, discussed hydration, hot shower, to CB if worsens.  This PM ctx are 2-6min.  At MAU FFN is positive, SVE 1-2cm.  D/W pt IVF, possible magnesium, and BMZ - and observation.  To thi point pregnancy relatively uncomplicated except + Chl, TOC ne also maternal obesity.  Has h/o HTN - improved with weight loss.  Also told pre-DM in HS.    Past Medical History:  Diagnosis Date  . Medical history non-contributory   h/o HTN, resolved preDM Maternal obesity  Past Surgical History:  Procedure Laterality Date  . NO PAST SURGERIES     Family History: family history includes Cancer in her other; Diabetes in her other; Hypertension in her father and mother. Social History:  reports that she has never smoked. She has never used smokeless tobacco. She reports that she drinks alcohol. She reports that she does not use drugs.   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   HENT: Negative.   Eyes: Negative.   Respiratory: Negative.   Cardiovascular: Negative.   Gastrointestinal: Negative.   Genitourinary: Negative.   Musculoskeletal: Positive for back pain.  Skin: Negative.   Neurological: Negative.   Psychiatric/Behavioral: Negative.    Maternal Medical History:  Reason for admission: Contractions.   Contractions: Onset was 13-24 hours ago.   Frequency: irregular.   Perceived severity is moderate.    Fetal activity: Perceived fetal activity is normal.    Prenatal Complications - Diabetes: none.      Blood pressure 112/76, pulse 106, temperature  98.4 F (36.9 C), temperature source Oral, resp. rate 20, height 5' (1.524 m), weight 113.4 kg (250 lb), last menstrual period 09/26/2015, SpO2 99 %. Maternal Exam:  Uterine Assessment: Contraction strength is moderate.  Contraction frequency is irregular.   Abdomen: Fundal height is appropriate for gestation.   Estimated fetal weight is 2#.    Introitus: Normal vulva. Normal vagina.  NP - cervix soft, 60% effaced, 1.2cm  Cervix: Cervix evaluated by sterile speculum exam and digital exam.     Physical Exam  Constitutional: She is oriented to person, place, and time. She appears well-developed and well-nourished.  HENT:  Head: Normocephalic and atraumatic.  Cardiovascular: Normal rate and regular rhythm.   Respiratory: Breath sounds normal. No respiratory distress. She has no wheezes.  GI: Soft. Bowel sounds are normal. She exhibits no distension. There is tenderness.  Musculoskeletal: Normal range of motion.  Neurological: She is alert and oriented to person, place, and time.  Skin: Skin is warm and dry.  Psychiatric: She has a normal mood and affect. Her behavior is normal.    Prenatal labs: ABO, Rh:  B+ Antibody:  neg Rubella:  immune RPR:   NR HBsAg:   neg HIV:   neg GBS:   pending  Hgb 13.4/Plt 338K/Ur Cx neg/ Chl+ - TOC neg/GC neg/VI/Hgb electro WNL/glucola 128  Nl NT, nl first tri [place] Nl anat, vtx, post plac, cwd, post plac, gender reveal  Assessment/Plan: 20yo G1P0 at 29 week with PTL BMZ x 2 Admit, IVF, poss Mg [place] to eval cervix and presentation GC/Chl/ GBBS pending FFN  positive Yeast/BV on wet prep  [name], Yesenia Dawson 05/05/2016, 6:19 PM

## 2016-05-06 LAB — GC/CHLAMYDIA PROBE AMP (~~LOC~~) NOT AT ARMC
CHLAMYDIA, DNA PROBE: NEGATIVE
Neisseria Gonorrhea: NEGATIVE

## 2016-05-06 NOTE — Progress Notes (Signed)
Patient ID: Yesenia Dawson, female   DOB: 04-Dec-1995, 21 y.o.   MRN: [id number] Pt reports starting to have double vision and feel "woozy". Denies any contractions, discharge or bleeding. +FMs VSS ABD - c/w dates EXT - mild edema, +2 DTRs  A/P: prime at 4329 1/[redacted]wks gestation on MgSO4 for PTL - no contractions all day         Second dose BMZ due at 1730         Stop MgSO4 after BMZ ie 22hrs         Monitor overnight for contractions         Likely discharge to home tomorrow if stable

## 2016-05-06 NOTE — Progress Notes (Signed)
   05/05/16 2351  Vital Signs  BP (!) 139/92  BP Location Right Arm  Patient Position (if appropriate) Semi-fowlers  BP Method Automatic  Pulse Rate (!) 104  Pulse Rate Source Monitor  Resp 18  Temp 98.4 F (36.9 C)  Temp Source Oral  Oxygen Therapy  SpO2 98 %  O2 Device Room Air  [facility] alert and oriented x 3. IV Magnesium Sulfate infusing @ 3 g/hr and LR @ 87.5 mls/hr.Patient denies pain, uterine contraction or discomfort. Patient was oriented to room, call bell in close reach and plan of care was discussed. Patient verbalized understanding and settled comfortable to bed. We will continue to monitor.

## 2016-05-06 NOTE — Progress Notes (Signed)
Patient ID: Yesenia Dawson, female   DOB: 1996-01-15, 21 y.o.   MRN: [id number] Pt doing well. Reports not appreciating any contractions; +Fms, no VB or leakage of fluid.  VSS FHT - 150s TOCO - no contractions SVE - deferred US - was vertex per scan 05/05/16; cervix dil  A/P: G1P0 @ 29 1/7wk with PTL - stable on Magnesium Sulfate at 3gm/hr and tolerating well-continue till 1940         NST q shift and prn         Second dose steroids due at 5:30pm         If stable possible discharge to home tomorrow on modified bedrest till 34weeks

## 2016-05-07 DIAGNOSIS — O42013 Preterm premature rupture of membranes, onset of labor within 24 hours of rupture, third trimester: Secondary | ICD-10-CM | POA: Diagnosis present

## 2016-05-07 DIAGNOSIS — Z8249 Family history of ischemic heart disease and other diseases of the circulatory system: Secondary | ICD-10-CM | POA: Diagnosis not present

## 2016-05-07 DIAGNOSIS — Z3A29 29 weeks gestation of pregnancy: Secondary | ICD-10-CM | POA: Diagnosis not present

## 2016-05-07 DIAGNOSIS — O26873 Cervical shortening, third trimester: Secondary | ICD-10-CM | POA: Diagnosis present

## 2016-05-07 DIAGNOSIS — B9689 Other specified bacterial agents as the cause of diseases classified elsewhere: Secondary | ICD-10-CM | POA: Diagnosis present

## 2016-05-07 DIAGNOSIS — O99214 Obesity complicating childbirth: Secondary | ICD-10-CM | POA: Diagnosis present

## 2016-05-07 DIAGNOSIS — Z833 Family history of diabetes mellitus: Secondary | ICD-10-CM | POA: Diagnosis not present

## 2016-05-07 DIAGNOSIS — Z6841 Body Mass Index (BMI) 40.0 and over, adult: Secondary | ICD-10-CM | POA: Diagnosis not present

## 2016-05-07 LAB — CBC
HEMATOCRIT: 36.3 % (ref 36.0–46.0)
HEMOGLOBIN: 12.2 g/dL (ref 12.0–15.0)
MCH: 29.5 pg (ref 26.0–34.0)
MCHC: 33.6 g/dL (ref 30.0–36.0)
MCV: 87.9 fL (ref 78.0–100.0)
Platelets: 333 10*3/uL (ref 150–400)
RBC: 4.13 MIL/uL (ref 3.87–5.11)
RDW: 14.7 % (ref 11.5–15.5)
WBC: 24.4 10*3/uL — ABNORMAL HIGH (ref 4.0–10.5)

## 2016-05-07 MED ORDER — BUTORPHANOL TARTRATE 1 MG/ML IJ SOLN
1.0000 mg | Freq: Once | INTRAMUSCULAR | Status: AC
  Administered 2016-05-07: 1 mg via INTRAVENOUS
  Filled 2016-05-07: qty 1

## 2016-05-07 MED ORDER — OXYCODONE-ACETAMINOPHEN 5-325 MG PO TABS: 2.0000 | ORAL_TABLET | ORAL | Status: DC | PRN

## 2016-05-07 MED ORDER — NIFEDIPINE 10 MG PO CAPS
10.0000 mg | ORAL_CAPSULE | Freq: Four times a day (QID) | ORAL | Status: DC
  Administered 2016-05-07: 10 mg via ORAL
  Filled 2016-05-07: qty 1

## 2016-05-07 MED ORDER — SODIUM CHLORIDE 0.9 % IV SOLN
2.0000 g | Freq: Once | INTRAVENOUS | Status: AC
  Administered 2016-05-07: 2 g via INTRAVENOUS
  Filled 2016-05-07: qty 2000

## 2016-05-07 MED ORDER — SODIUM CHLORIDE 0.9 % IV SOLN
2.0000 g | INTRAVENOUS | Status: DC
  Administered 2016-05-07: 2 g via INTRAVENOUS
  Filled 2016-05-07: qty 2000

## 2016-05-07 MED ORDER — MAGNESIUM SULFATE BOLUS VIA INFUSION
6.0000 g | Freq: Once | INTRAVENOUS | Status: AC
  Administered 2016-05-07: 6 g via INTRAVENOUS
  Filled 2016-05-07: qty 500

## 2016-05-07 MED ORDER — ACETAMINOPHEN 325 MG PO TABS: 650.0000 mg | ORAL_TABLET | ORAL | Status: DC | PRN

## 2016-05-07 MED ORDER — LACTATED RINGERS IV SOLN: INTRAVENOUS | Status: DC

## 2016-05-07 MED ORDER — LIDOCAINE HCL (PF) 1 % IJ SOLN
30.0000 mL | INTRAMUSCULAR | Status: DC | PRN
  Filled 2016-05-07: qty 30

## 2016-05-07 MED ORDER — BUTORPHANOL TARTRATE 1 MG/ML IJ SOLN: 1.0000 mg | Freq: Once | INTRAMUSCULAR | Status: DC

## 2016-05-07 MED ORDER — NIFEDIPINE 10 MG PO CAPS
20.0000 mg | ORAL_CAPSULE | Freq: Once | ORAL | Status: AC
  Administered 2016-05-07: 20 mg via ORAL
  Filled 2016-05-07: qty 2

## 2016-05-07 MED ORDER — OXYTOCIN 40 UNITS IN LACTATED RINGERS INFUSION - SIMPLE MED
2.5000 [IU]/h | INTRAVENOUS | Status: DC
  Administered 2016-05-08: 2.5 [IU]/h via INTRAVENOUS
  Filled 2016-05-07: qty 1000

## 2016-05-07 MED ORDER — SOD CITRATE-CITRIC ACID 500-334 MG/5ML PO SOLN
30.0000 mL | ORAL | Status: DC | PRN
  Administered 2016-05-08: 30 mL via ORAL
  Filled 2016-05-07: qty 15

## 2016-05-07 MED ORDER — LACTATED RINGERS IV SOLN: 500.0000 mL | INTRAVENOUS | Status: DC | PRN

## 2016-05-07 MED ORDER — LACTATED RINGERS IV BOLUS (SEPSIS)
500.0000 mL | Freq: Once | INTRAVENOUS | Status: AC
  Administered 2016-05-07: 500 mL via INTRAVENOUS

## 2016-05-07 MED ORDER — MAGNESIUM SULFATE 50 % IJ SOLN
2.0000 g/h | INTRAVENOUS | Status: DC
  Administered 2016-05-08: 2 g/h via INTRAVENOUS
  Filled 2016-05-07 (×2): qty 80

## 2016-05-07 MED ORDER — ONDANSETRON HCL 4 MG/2ML IJ SOLN: 4.0000 mg | Freq: Four times a day (QID) | INTRAMUSCULAR | Status: DC | PRN

## 2016-05-07 MED ORDER — PENICILLIN G POT IN DEXTROSE 60000 UNIT/ML IV SOLN
3.0000 10*6.[IU] | INTRAVENOUS | Status: DC
  Administered 2016-05-07 – 2016-05-08 (×3): 3 10*6.[IU] via INTRAVENOUS
  Filled 2016-05-07 (×10): qty 50

## 2016-05-07 MED ORDER — METRONIDAZOLE 500 MG PO TABS
500.0000 mg | ORAL_TABLET | Freq: Two times a day (BID) | ORAL | Status: DC
  Administered 2016-05-07 – 2016-05-08 (×3): 500 mg via ORAL
  Filled 2016-05-07 (×5): qty 1

## 2016-05-07 MED ORDER — OXYCODONE-ACETAMINOPHEN 5-325 MG PO TABS: 1.0000 | ORAL_TABLET | ORAL | Status: DC | PRN

## 2016-05-07 MED ORDER — OXYTOCIN BOLUS FROM INFUSION
500.0000 mL | Freq: Once | INTRAVENOUS | Status: AC
  Administered 2016-05-08: 500 mL via INTRAVENOUS

## 2016-05-07 MED ORDER — PENICILLIN G POTASSIUM 5000000 UNITS IJ SOLR
5.0000 10*6.[IU] | Freq: Once | INTRAVENOUS | Status: AC
  Administered 2016-05-07: 5 10*6.[IU] via INTRAVENOUS
  Filled 2016-05-07: qty 5

## 2016-05-07 MED ORDER — BUTORPHANOL TARTRATE 1 MG/ML IJ SOLN
INTRAMUSCULAR | Status: AC
  Administered 2016-05-07: 1 mg
  Filled 2016-05-07: qty 1

## 2016-05-07 NOTE — Progress Notes (Signed)
SVE done per MD request. SVE 2.5/100/0. ? Overriding sutures felt.   0604: MD notified and report status of pt given.

## 2016-05-07 NOTE — Progress Notes (Signed)
Patient ID: Yesenia Dawson, female   DOB: 1995-10-18, 21 y.o.   MRN: [id number] Pt becoming increasingly uncomfortable again.  Cervix with some change Per RN exam  afeb vss FHR reassuring when tracing, pt hard to keep on with movement  Will transfer pt back to L&D  Begin Magnesium for CP prophylaxis and follow for further cervical change Stadol for pain until obvious progressing in labor GBS still pending--will change ampicillin to PCN and continue coverage

## 2016-05-07 NOTE — Progress Notes (Signed)
Patient ID: Yesenia Dawson, female   DOB: 04-14-1995, 21 y.o.   MRN: [id number] G1P0 @ 7229 2/7wk with PTL  Pt came off magnesium last PM and began to have increased contractions.  Continues to complain off them being too uncomfortable to tolerate, not really timing tihem.  afeb vss FHR category 1  Cervix remains unchanged 3/100/-1 Bag felt  S/p betamethasone x 2 Begun on procardia this AM, just received second dose Ampicillin x 1 given, may continue for now as GBS pending and unclear if pt progressing +BV started on flagyl Will give stadol x 1 Consider restart of magnesium for CP prophylaxis if cervix changes Has had NICU consult

## 2016-05-07 NOTE — Progress Notes (Signed)
Patient ID: Yesenia Dawson, female   DOB: May 29, 1995, 21 y.o.   MRN: [id number] Pt reported increased pelvic discomfort. I arrived to recheck her and SVE unchanged from my initial exam at 0500am  Will continue with current mgmt Also start on ampicillin for GBS prophylaxis as still pending

## 2016-05-07 NOTE — Progress Notes (Signed)
Patient ID: Yesenia Dawson, female   DOB: 1995-06-28, 21 y.o.   MRN: [id number] Called to check on pt and informed pt c/o difficulty sleeping - having cramping.  Pt placed on monitors and noted to be contracting q 5mins. Rates at 5/10. No VB or LOF. +FMs  EFM- 120s, cat 1 TOCO - ctxs q 5mins SVE - 3/100/-2; intact membranes ( was 2/80 on admit)  A/P: Prime at 29 2/[redacted]wks gestation with preterm contractions and cervical dilation s/p MgSO4 and BMZ         Will start on flagyl 500mg  po bid for noted BV          Will treat yeast once done with antibx          500mg  IVF bolus LR          Procardia 20mg  po now then 10mg  q 6h to follow           If contractions persist will need to transfer to L/D

## 2016-05-07 NOTE — Progress Notes (Addendum)
Patient ID: Yesenia Dawson, female   DOB: [birth year], 21 y.o.   MRN: [id number] Pt has been managed most of day with stadol q 3-4 hours.  States contractions still painful but improved since beginning magnesium and more spaced out.  afeb vss  FHR 150 and decreased variability secondary to magnesium and stadol  Cervix 100/4+/0  Cervix essentially with minimal change from my exam at 1330.  Advised patient she may be in latent labor but is not in active labor.  We are managing her pain with stadol and she is happy with this.  Discussed reasons for not committing to epidural yet and she agrees.  Will continue to monitor progress closely.  Continue Magnesium for CP prophylaxis for at least 12 hours and PCN for unknown GBS, not back yet.

## 2016-05-08 ENCOUNTER — Inpatient Hospital Stay (HOSPITAL_COMMUNITY): Payer: 59 | Admitting: Anesthesiology

## 2016-05-08 ENCOUNTER — Encounter (HOSPITAL_COMMUNITY): Payer: Self-pay | Admitting: *Deleted

## 2016-05-08 LAB — CULTURE, BETA STREP (GROUP B ONLY)

## 2016-05-08 LAB — RPR: RPR Ser Ql: NONREACTIVE

## 2016-05-08 MED ORDER — OXYCODONE HCL 5 MG PO TABS: 5.0000 mg | ORAL_TABLET | ORAL | Status: DC | PRN

## 2016-05-08 MED ORDER — EPHEDRINE 5 MG/ML INJ: 10.0000 mg | INTRAVENOUS | Status: DC | PRN

## 2016-05-08 MED ORDER — COCONUT OIL OIL
1.0000 "application " | TOPICAL_OIL | Status: DC | PRN
  Administered 2016-05-08: 1 via TOPICAL
  Filled 2016-05-08: qty 120

## 2016-05-08 MED ORDER — PHENYLEPHRINE 40 MCG/ML (10ML) SYRINGE FOR IV PUSH (FOR BLOOD PRESSURE SUPPORT): 80.0000 ug | PREFILLED_SYRINGE | INTRAVENOUS | Status: DC | PRN

## 2016-05-08 MED ORDER — BENZOCAINE-MENTHOL 20-0.5 % EX AERO
1.0000 "application " | INHALATION_SPRAY | CUTANEOUS | Status: DC | PRN
  Administered 2016-05-08: 1 via TOPICAL
  Filled 2016-05-08: qty 56

## 2016-05-08 MED ORDER — EPHEDRINE 5 MG/ML INJ
10.0000 mg | INTRAVENOUS | Status: DC | PRN
  Filled 2016-05-08: qty 4

## 2016-05-08 MED ORDER — MEASLES, MUMPS & RUBELLA VAC ~~LOC~~ INJ
0.5000 mL | INJECTION | Freq: Once | SUBCUTANEOUS | Status: DC
  Filled 2016-05-08: qty 0.5

## 2016-05-08 MED ORDER — ACETAMINOPHEN 325 MG PO TABS: 650.0000 mg | ORAL_TABLET | ORAL | Status: DC | PRN

## 2016-05-08 MED ORDER — ZOLPIDEM TARTRATE 5 MG PO TABS: 5.0000 mg | ORAL_TABLET | Freq: Every evening | ORAL | Status: DC | PRN

## 2016-05-08 MED ORDER — SENNOSIDES-DOCUSATE SODIUM 8.6-50 MG PO TABS
2.0000 | ORAL_TABLET | ORAL | Status: DC
  Administered 2016-05-09 – 2016-05-10 (×2): 2 via ORAL
  Filled 2016-05-08 (×2): qty 2

## 2016-05-08 MED ORDER — SIMETHICONE 80 MG PO CHEW: 80.0000 mg | CHEWABLE_TABLET | ORAL | Status: DC | PRN

## 2016-05-08 MED ORDER — LACTATED RINGERS IV SOLN: 500.0000 mL | Freq: Once | INTRAVENOUS | Status: DC

## 2016-05-08 MED ORDER — FENTANYL 2.5 MCG/ML BUPIVACAINE 1/10 % EPIDURAL INFUSION (WH - ANES)
INTRAMUSCULAR | Status: AC
  Filled 2016-05-08: qty 100

## 2016-05-08 MED ORDER — DIPHENHYDRAMINE HCL 50 MG/ML IJ SOLN: 12.5000 mg | INTRAMUSCULAR | Status: DC | PRN

## 2016-05-08 MED ORDER — OXYCODONE HCL 5 MG PO TABS: 10.0000 mg | ORAL_TABLET | ORAL | Status: DC | PRN

## 2016-05-08 MED ORDER — DIPHENHYDRAMINE HCL 25 MG PO CAPS: 25.0000 mg | ORAL_CAPSULE | Freq: Four times a day (QID) | ORAL | Status: DC | PRN

## 2016-05-08 MED ORDER — OXYTOCIN 40 UNITS IN LACTATED RINGERS INFUSION - SIMPLE MED
1.0000 m[IU]/min | INTRAVENOUS | Status: DC
  Administered 2016-05-08: 1 m[IU]/min via INTRAVENOUS

## 2016-05-08 MED ORDER — IBUPROFEN 600 MG PO TABS
600.0000 mg | ORAL_TABLET | Freq: Four times a day (QID) | ORAL | Status: DC
  Administered 2016-05-08 – 2016-05-10 (×8): 600 mg via ORAL
  Filled 2016-05-08 (×8): qty 1

## 2016-05-08 MED ORDER — FENTANYL 2.5 MCG/ML BUPIVACAINE 1/10 % EPIDURAL INFUSION (WH - ANES): 14.0000 mL/h | INTRAMUSCULAR | Status: DC | PRN

## 2016-05-08 MED ORDER — LIDOCAINE HCL (PF) 1 % IJ SOLN
INTRAMUSCULAR | Status: DC | PRN
  Administered 2016-05-08: 10 mL via EPIDURAL

## 2016-05-08 MED ORDER — PHENYLEPHRINE 40 MCG/ML (10ML) SYRINGE FOR IV PUSH (FOR BLOOD PRESSURE SUPPORT)
PREFILLED_SYRINGE | INTRAVENOUS | Status: AC
  Filled 2016-05-08: qty 10

## 2016-05-08 MED ORDER — METHYLERGONOVINE MALEATE 0.2 MG PO TABS: 0.2000 mg | ORAL_TABLET | ORAL | Status: DC | PRN

## 2016-05-08 MED ORDER — WITCH HAZEL-GLYCERIN EX PADS: 1.0000 "application " | MEDICATED_PAD | CUTANEOUS | Status: DC | PRN

## 2016-05-08 MED ORDER — TETANUS-DIPHTH-ACELL PERTUSSIS 5-2.5-18.5 LF-MCG/0.5 IM SUSP: 0.5000 mL | Freq: Once | INTRAMUSCULAR | Status: DC

## 2016-05-08 MED ORDER — DIBUCAINE 1 % RE OINT: 1.0000 "application " | TOPICAL_OINTMENT | RECTAL | Status: DC | PRN

## 2016-05-08 MED ORDER — PRENATAL MULTIVITAMIN CH
1.0000 | ORAL_TABLET | Freq: Every day | ORAL | Status: DC
  Administered 2016-05-09: 1 via ORAL
  Filled 2016-05-08: qty 1

## 2016-05-08 MED ORDER — ONDANSETRON HCL 4 MG/2ML IJ SOLN: 4.0000 mg | INTRAMUSCULAR | Status: DC | PRN

## 2016-05-08 MED ORDER — PHENYLEPHRINE 40 MCG/ML (10ML) SYRINGE FOR IV PUSH (FOR BLOOD PRESSURE SUPPORT)
80.0000 ug | PREFILLED_SYRINGE | INTRAVENOUS | Status: DC | PRN
  Filled 2016-05-08: qty 5

## 2016-05-08 MED ORDER — TERBUTALINE SULFATE 1 MG/ML IJ SOLN: 0.2500 mg | Freq: Once | INTRAMUSCULAR | Status: DC | PRN

## 2016-05-08 MED ORDER — MAGNESIUM HYDROXIDE 400 MG/5ML PO SUSP: 30.0000 mL | ORAL | Status: DC | PRN

## 2016-05-08 MED ORDER — CALCIUM CARBONATE ANTACID 500 MG PO CHEW: 1.0000 | CHEWABLE_TABLET | Freq: Three times a day (TID) | ORAL | Status: DC | PRN

## 2016-05-08 MED ORDER — FENTANYL 2.5 MCG/ML BUPIVACAINE 1/10 % EPIDURAL INFUSION (WH - ANES)
14.0000 mL/h | INTRAMUSCULAR | Status: DC | PRN
  Administered 2016-05-08 (×2): 14 mL/h via EPIDURAL
  Filled 2016-05-08: qty 100

## 2016-05-08 MED ORDER — ONDANSETRON HCL 4 MG PO TABS: 4.0000 mg | ORAL_TABLET | ORAL | Status: DC | PRN

## 2016-05-08 MED ORDER — METHYLERGONOVINE MALEATE 0.2 MG/ML IJ SOLN: 0.2000 mg | INTRAMUSCULAR | Status: DC | PRN

## 2016-05-08 NOTE — Lactation Note (Signed)
This note was copied from a baby's chart. Lactation Consultation Note  Patient Name: Yesenia Dawson ZOXWR'UToday's Date: 05/08/2016 Reason for consult: Initial assessment;NICU baby Breastfeeding consultation services and Providing Breastmilk For Your Baby In NICU booklet given.  This is mom's first baby and she desires to provide breastmilk for her baby.  Symphony pump set up and initiated.  Instructed to pump/hand express every 3 hours.  Mom states she has a DEBP at home.  Encouraged to call for assist/concerns.  Maternal Data Has patient been taught Hand Expression?: Yes Does the patient have breastfeeding experience prior to this delivery?: No  Feeding    LATCH Score/Interventions                      Lactation Tools Discussed/Used Pump Review: Setup, frequency, and cleaning;Milk Storage Initiated by:: LC Date initiated:: 05/08/16   Consult Status Consult Status: Follow-up Date: 05/09/16 Follow-up type: In-patient    Huston FoleyMOULDEN, Kimyah Frein S 05/08/2016, 2:49 PM

## 2016-05-08 NOTE — Progress Notes (Signed)
Feels pressure with ctx Afeb, VSS FHT-140-150, mod variability, variable decels with ctx, Cat II, ctx q 8 min VE-9/C/+1 Will augment with pitocin, anticipate SVD

## 2016-05-08 NOTE — Anesthesia Preprocedure Evaluation (Signed)
Anesthesia Evaluation  Patient identified by MRN, date of birth, ID band Patient awake    Reviewed: Allergy & Precautions, NPO status , Patient's Chart, lab work & pertinent test results  Airway Mallampati: II  TM Distance: >3 FB Neck ROM: Full    Dental no notable dental hx.    Pulmonary neg pulmonary ROS,    Pulmonary exam normal breath sounds clear to auscultation       Cardiovascular negative cardio ROS Normal cardiovascular exam Rhythm:Regular Rate:Normal     Neuro/Psych negative neurological ROS  negative psych ROS   GI/Hepatic negative GI ROS, Neg liver ROS,   Endo/Other  Morbid obesity  Renal/GU negative Renal ROS  negative genitourinary   Musculoskeletal negative musculoskeletal ROS (+)   Abdominal   Peds negative pediatric ROS (+)  Hematology negative hematology ROS (+)   Anesthesia Other Findings   Reproductive/Obstetrics negative OB ROS (+) Pregnancy                             Anesthesia Physical Anesthesia Plan  ASA: III  Anesthesia Plan: Epidural   Post-op Pain Management:    Induction:   Airway Management Planned: Natural Airway  Additional Equipment:   Intra-op Plan:   Post-operative Plan:   Informed Consent:   Plan Discussed with:   Anesthesia Plan Comments:         Anesthesia Quick Evaluation

## 2016-05-08 NOTE — Progress Notes (Signed)
Patient ID: Yesenia Dawson, female   DOB: 1995-05-29, 21 y.o.   MRN: [id number] Pt comfortable with epidural.  Feeling intermittent pressure with contractions  FHR baseline 150 with occasional variable decels  Cervix c/8/0 to+1  Progressing in labor NICU aware of eminent delivery Magnesium on board for CP prophylaxis PCN for unknown GBS status preterm

## 2016-05-08 NOTE — Anesthesia Procedure Notes (Signed)
Epidural Patient location during procedure: OB Start time: 05/08/2016 1:52 AM End time: 05/08/2016 2:14 AM  Staffing Anesthesiologist: Sharis LauthMILLER, Emberlin Verner RAY Performed: anesthesiologist   Preanesthetic Checklist Completed: patient identified, site marked, surgical consent, pre-op evaluation, timeout performed, IV checked, risks and benefits discussed and monitors and equipment checked  Epidural Patient position: sitting Prep: ChloraPrep Patient monitoring: heart rate, cardiac monitor, continuous pulse ox and blood pressure Approach: midline Location: L3-L4 Injection technique: LOR air  Needle:  Needle type: Tuohy  Needle gauge: 17 G Needle length: 9 cm Needle insertion depth: 6 cm Catheter type: closed end flexible Catheter size: 20 Guage Catheter at skin depth: 10 cm Test dose: negative  Assessment Events: blood not aspirated, injection not painful, no injection resistance, negative IV test and no paresthesia  Additional Notes Reason for block:procedure for pain

## 2016-05-08 NOTE — Anesthesia Pain Management Evaluation Note (Signed)
  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 We have an anesthesia team available at all times to provide care throughout the hospital, including epidural management and anesthesia for C-section. I don't know your plan for the delivery whether it a natural birth, water birth, IV sedation, nitrous supplementation, doula or epidural, but we want to meet your pain goals."   1.Was your pain managed to your expectations on prior hospitalizations?   No prior hospitalizations  2.What is your expectation for pain management during this hospitalization?     Epidural  3.How can we help you reach that goal? Epidural infusion in place.   Record the patient's initial score and the patient's pain goal.   Pain: 0  Pain Goal: 0 [facility] wants you to be able to say your pain was always managed very well.  Brittin Belnap 05/08/2016

## 2016-05-08 NOTE — Progress Notes (Signed)
Still cervix most of the way around head at +1, I cannot consistently reduce it with pushing FHT remains Cat II, improved variability, less variable decels Continue pitocin and monitor progress

## 2016-05-08 NOTE — Progress Notes (Signed)
Patient ID: Yesenia Dawson, female   DOB: 12-Mar-1995, 21 y.o.   MRN: [id number] CTSP after SROM clear to pinkish fluid  Pt had SROM at 0130am and immediately felt more uncomfortable with contractions so allowing epidural  FHR 140 baseline with some decreased variability on magnesium  Cervix just after SROM still about 5cm per RN Will follow progress and continue magnesium for CP prophylaxis

## 2016-05-09 NOTE — Lactation Note (Signed)
This note was copied from a baby's chart. Lactation Consultation Note  Patient Name: Yesenia Dawson UEAVW'UToday's Date: 05/09/2016  Mom is not pumping consistently.  Reviewed importance of pumping 8-12 times per day followed by hand expression.  Mom is obtaining a few drops with hand expression.  [facility]   Maternal Data    Feeding    LATCH Score/Interventions                      Lactation Tools Discussed/Used     Consult Status      Huston FoleyMOULDEN, Fabrizio Filip S 05/09/2016, 12:14 PM

## 2016-05-09 NOTE — Progress Notes (Signed)
Post Partum Day 1 Subjective: no complaints, up ad lib, voiding, tolerating PO and nl lochia, pain controlled  Objective: Blood pressure 105/68, pulse 94, temperature 98 F (36.7 C), temperature source Oral, resp. rate 16, height 5' (1.524 m), weight 113.4 kg (250 lb), last menstrual period 09/26/2015, SpO2 100 %, unknown if currently breastfeeding.  Physical Exam:  General: alert and no distress Lochia: appropriate Uterine Fundus: firm   Recent Labs  05/07/16 1505  HGB 12.2  HCT 36.3    Assessment/Plan: Plan for discharge tomorrow, Breastfeeding and Lactation consult.  Baby in NICU - doing well   LOS: 2 days   [name], Yesenia Dawson 05/09/2016, 8:57 AM

## 2016-05-09 NOTE — Anesthesia Postprocedure Evaluation (Signed)
Anesthesia Post Note  Patient: Engineer, maintenanceAnitra Dawson  Procedure(s) Performed: * No procedures listed *  Patient location during evaluation: Mother Baby Anesthesia Type: Epidural Level of consciousness: awake and alert and oriented Pain management: satisfactory to patient Vital Signs Assessment: post-procedure vital signs reviewed and stable Respiratory status: spontaneous breathing and nonlabored ventilation Cardiovascular status: stable Postop Assessment: no headache, no backache, no signs of nausea or vomiting, adequate PO intake and patient able to bend at knees (patient up walking) Anesthetic complications: no        Last Vitals:  Vitals:   05/09/16 0600 05/09/16 0840  BP: 106/75 105/68  Pulse: 100 94  Resp: 18 16  Temp: 37.1 C 36.7 C    Last Pain:  Vitals:   05/09/16 0840  TempSrc: Oral  PainSc: 0-No pain   Pain Goal: Patients Stated Pain Goal: 3 (05/09/16 0840)               Madison HickmanGREGORY,Livi Mcgann

## 2016-05-10 MED ORDER — IBUPROFEN 600 MG PO TABS: 600.0000 mg | ORAL_TABLET | Freq: Four times a day (QID) | ORAL | 0 refills | Status: AC | PRN

## 2016-05-10 NOTE — Discharge Summary (Signed)
OB Discharge Summary     Patient Name: Yesenia Dawson DOB: 12-Jul-1995 MRN: [id number]  Date of admission: 05/05/2016 Delivering MD: Jackelyn KnifeMEISINGER, TODD   Date of discharge: 05/10/2016  Admitting diagnosis: 29 WKS, PELVIC PAIN Intrauterine pregnancy: 6771w3d     Secondary diagnosis:  Active Problems:   Preterm labor in third trimester   SVD (spontaneous vaginal delivery)  Additional problems: none     Discharge diagnosis: Preterm Pregnancy Delivered                                                                                                Post partum procedures:none  Augmentation: Pitocin  Complications: None  Hospital course:  Onset of Labor With Vaginal Delivery     21 y.o. yo G1P0101 at 8571w3d was admitted in Latent Labor on 05/05/2016. Patient had a labor course as follows: after noted +ffn, pt received MgSO4 over a 24hour course while receiving BMZ. She did well for about 18hrs afterwards then begun having contractions again. Oral procardia was unable to stop contractions hence MgSO4 was restarted for CP prophylaxis. Pt was also treated for BV and GBS prophylaxis. Pt then spontaneously ruptured and progressed into labor with eventual svd.  Membrane Rupture Time/Date: 1:32 AM ,05/08/2016   Intrapartum Procedures: Episiotomy: None [1]                                         Lacerations:  None [1]  Patient had a delivery of a Viable infant. 05/08/2016  Information for the patient's newborn:  Hilario Quarryoteat, Boy Kavina [[id number]][[id number]]  Delivery Method: Vaginal, Spontaneous Delivery (Filed from Delivery Summary)    Pateint had an uncomplicated postpartum course.  She is ambulating, tolerating a regular diet, passing flatus, and urinating well. Patient is discharged home in stable condition on 05/10/16.   Physical exam  Vitals:   05/09/16 2100 05/10/16 0100 05/10/16 0618 05/10/16 0903  BP:  129/85 113/74 119/84  Pulse: 100 98 100 (!) 112  Resp: 16 16 18 18   Temp: 98.9 F (37.2 C) 98.6 F  (37 C) 98.6 F (37 C) 98.2 F (36.8 C)  TempSrc: Oral Oral Oral Oral  SpO2: 100% 100% 98% 95%  Weight:      Height:       General: alert, cooperative and no distress Lochia: appropriate Uterine Fundus: firm Incision: N/A DVT Evaluation: No evidence of DVT seen on physical exam. No significant calf/ankle edema. Labs: Lab Results  Component Value Date   WBC 24.4 (H) 05/07/2016   HGB 12.2 05/07/2016   HCT 36.3 05/07/2016   MCV 87.9 05/07/2016   PLT 333 05/07/2016   CMP Latest Ref Rng & Units 11/13/2015  Glucose 65 - 99 mg/dL 79  BUN 6 - 20 mg/dL 5(L)  Creatinine 7.820.44 - 1.00 mg/dL 9.560.70  Sodium 213135 - 086145 mmol/L 139  Potassium 3.5 - 5.1 mmol/L 3.4(L)  Chloride 101 - 111 mmol/L 105  CO2 16 - 25 mmol/L -  Calcium 9.0 - 10.7 mg/dL -  Total Protein 6.4 - 8.6 g/dL -  Total Bilirubin 0.2 - 1.0 mg/dL -  Alkaline Phos 82 - 161 Unit/L -  AST 0 - 26 Unit/L -  ALT 12 - 78 U/L -    Discharge instruction: per After Visit Summary and "Baby and Me Booklet".  After visit meds:  Allergies as of 05/10/2016   No Known Allergies     Medication List    TAKE these medications   ibuprofen 600 MG tablet Commonly known as:  ADVIL,MOTRIN Take 1 tablet (600 mg total) by mouth every 6 (six) hours as needed for fever, headache, moderate pain or cramping.   VITAFOL GUMMIES 3.33-0.333-34.8 MG Chew Chew 3 each by mouth daily.       Diet: routine diet  Activity: Advance as tolerated. Pelvic rest for 6 weeks.   Outpatient follow up:6 weeks Follow up Appt:No future appointments. Follow up Visit:No Follow-up on file.  Postpartum contraception: Undecided  Newborn Data: Live born female  Birth Weight: 2 lb 15.6 oz (1350 g) APGAR: 2, 8  Baby Feeding: Bottle and Breast Disposition:NICU   05/10/2016 Sharol Given Banga, DO

## 2016-05-10 NOTE — Lactation Note (Signed)
This note was copied from a baby's chart. Lactation Consultation Note  Patient Name: Yesenia Dawson WGNFA'OToday's Date: 05/10/2016  Mom is seeing some colostrum with hand expression.  Instructed to continue pumping 8-12 times per day followed by hand expression.  [facility] today.  Encouraged to call with concerns/assist.   Maternal Data    Feeding Feeding Type: Donor Breast Milk Length of feed: 20 min  LATCH Score/Interventions                      Lactation Tools Discussed/Used     Consult Status      Huston FoleyMOULDEN, Pretty Weltman S 05/10/2016, 11:41 AM

## 2016-05-10 NOTE — Progress Notes (Signed)
Patient ID: Yesenia Dawson, female   DOB: 1995-04-09, 21 y.o.   MRN: [id number] Pt doing well. Pain controlled, lochia mild, ambulating well with no lightheadedness, tolerating diet with no n/v. She is attempting pumping. Ready for discharge to home today VSS ABD- FF and below umbilicus EXT - no homans, no edema  A/P: PPD#2 s/p preterm vaginal delivery - stable         Reviewed discharge instructions         Unsure BC         Follow up in office in 6weeks

## 2016-05-10 NOTE — Discharge Instructions (Signed)
Nothing in vagina for 6 weeks.  No sex, tampons, and douching.  Other instructions as in [facility] Discharge Booklet. °

## 2016-05-10 NOTE — Progress Notes (Signed)
Discharge instructions reviewed with patient. Patient verbalized an understanding of instructions.  

## 2016-05-22 ENCOUNTER — Ambulatory Visit: Payer: Self-pay

## 2016-05-22 NOTE — Lactation Note (Signed)
This note was copied from a baby's chart. Lactation Consultation Note  Patient Name: Boy Maree Erienitra [place] WUJWJ'XToday's Date: 05/22/2016  Mom states she stopped pumping because she wasn't obtaining any milk.  She feels good that baby did receive some breast milk.   Maternal Data    Feeding Feeding Type: Donor Breast Milk Length of feed: 120 min  LATCH Score/Interventions                      Lactation Tools Discussed/Used     Consult Status      Huston FoleyMOULDEN, Aidan Caloca S 05/22/2016, 12:17 PM

## 2017-08-27 ENCOUNTER — Ambulatory Visit (HOSPITAL_COMMUNITY): Admission: EM | Admit: 2017-08-27 | Discharge: 2017-08-27 | Discharge status: Against Medical Advice | Payer: 59

## 2019-02-21 IMAGING — US US MFM OB TRANSVAGINAL
1 series · 15 of 27 positions shown · non-contrast
Comparison: none

[Series 1: us mfm ob transvaginal · 27 acquisitions, 15 frames shown]
[im 1/27]
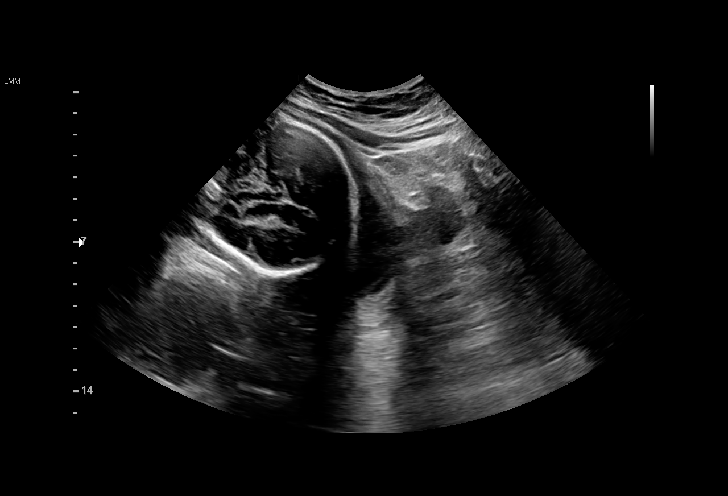
[im 3/27]
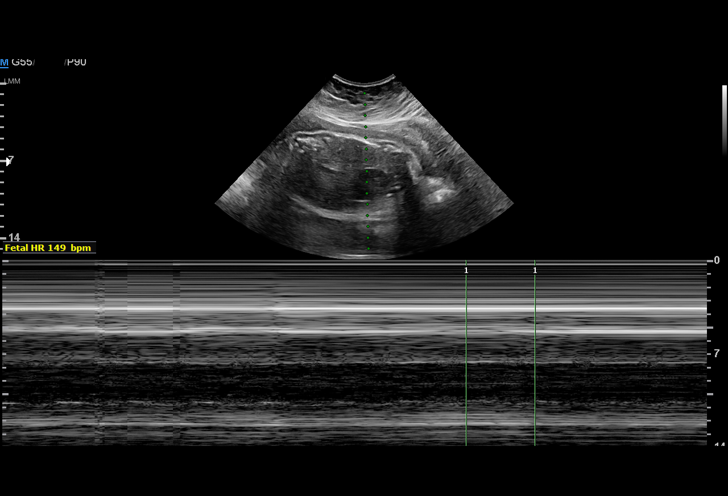
[im 5/27]
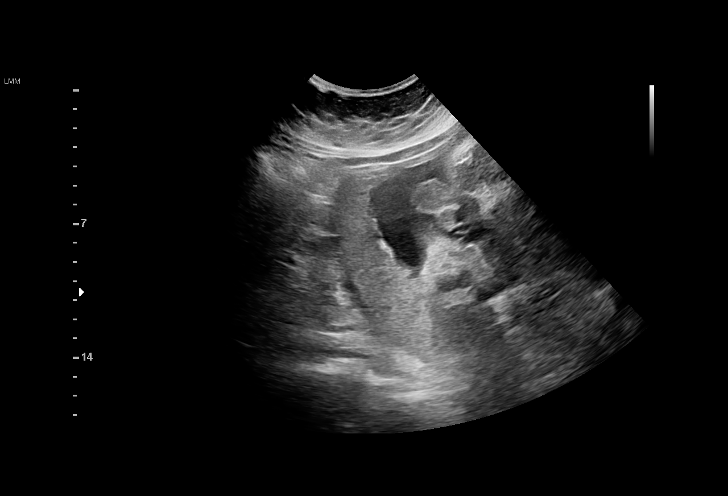
[im 7/27]
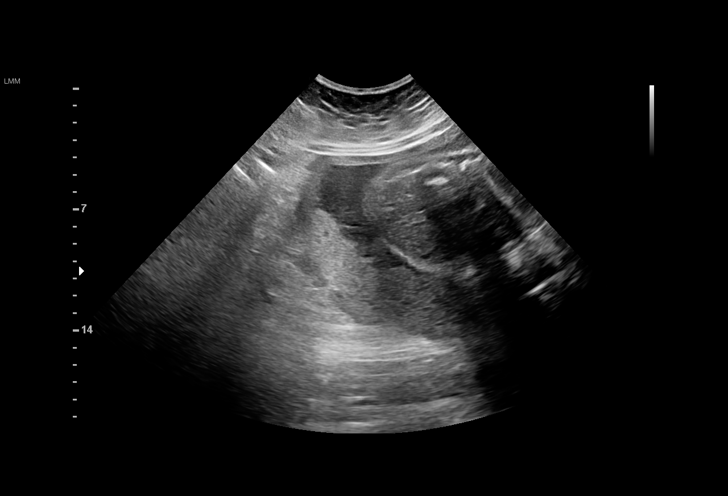
[im 9/27]
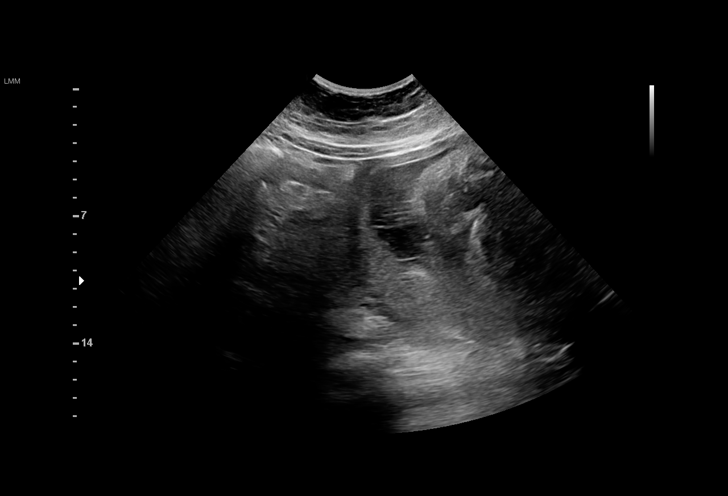
[im 10/27]
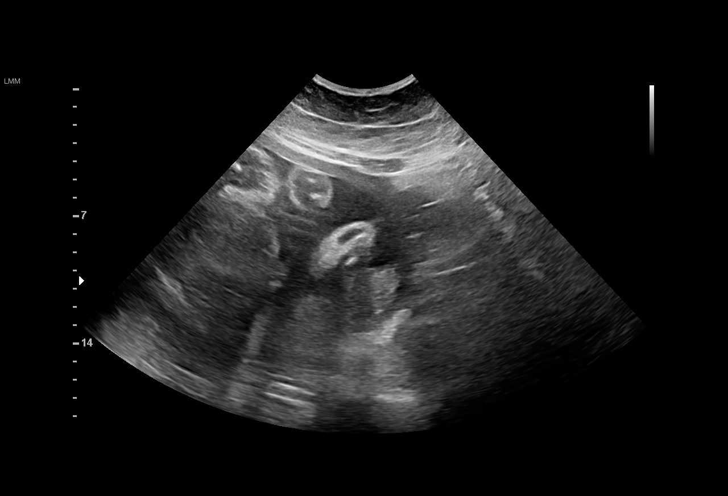
[im 12/27]
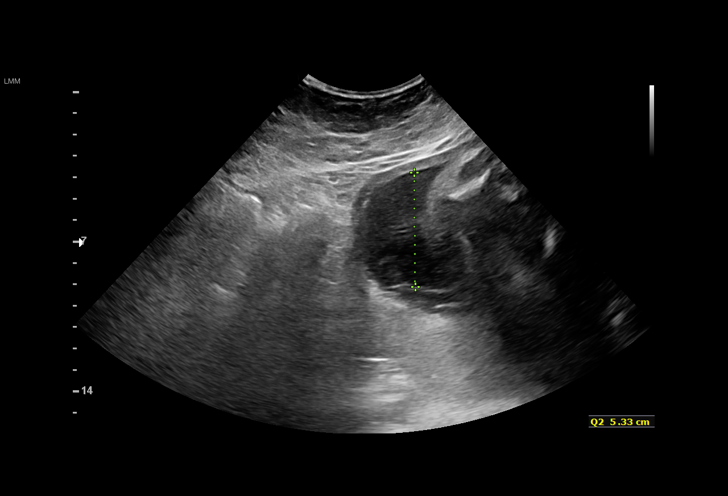
[im 14/27]
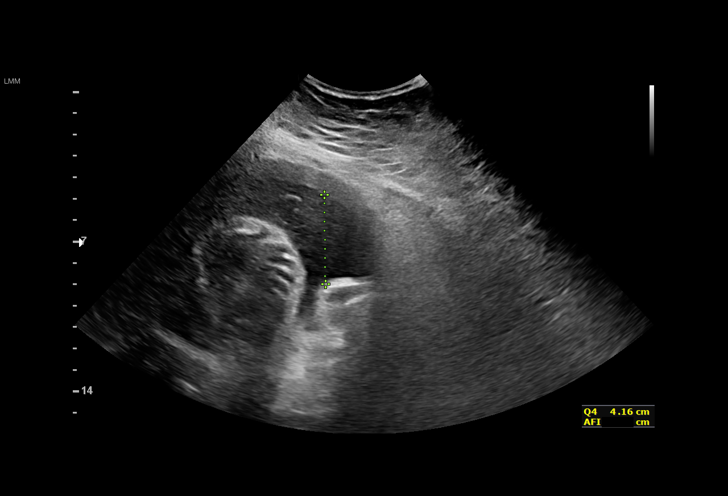
[im 16/27]
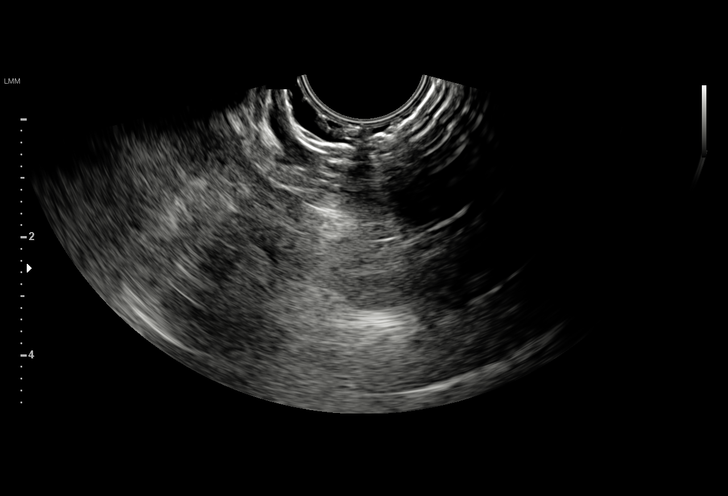
[im 18/27]
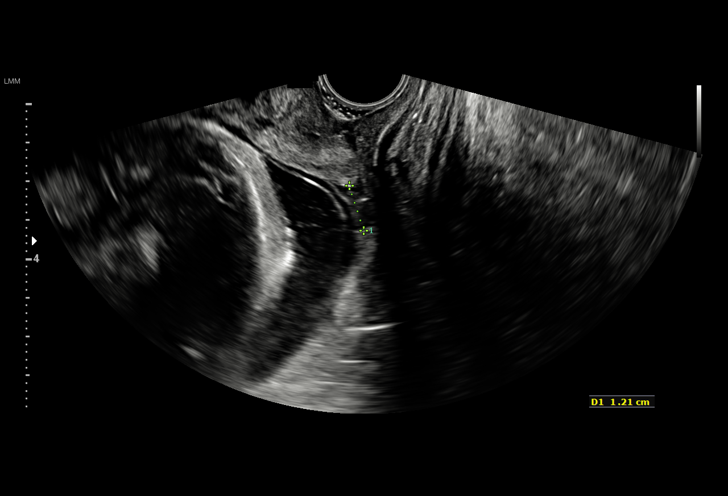
[im 19/27]
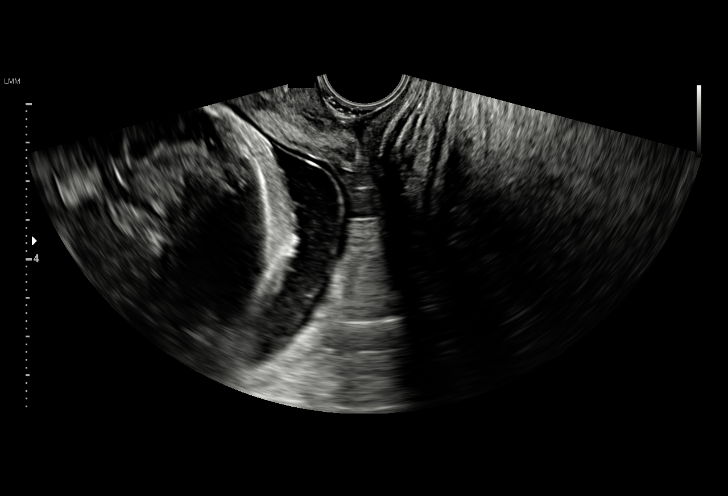
[im 21/27]
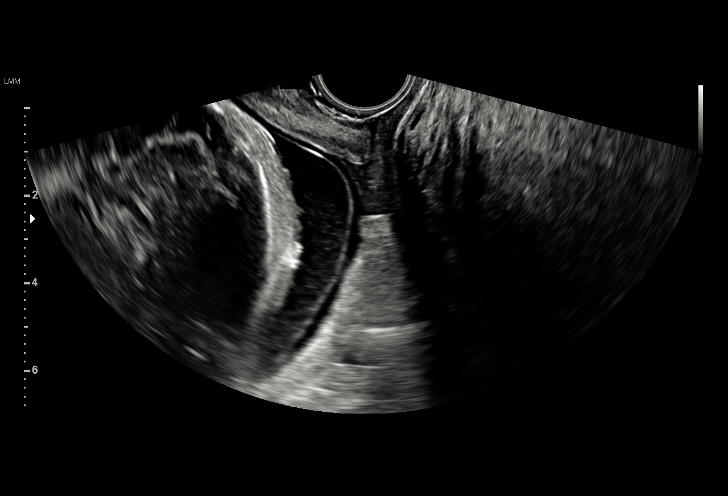
[im 23/27]
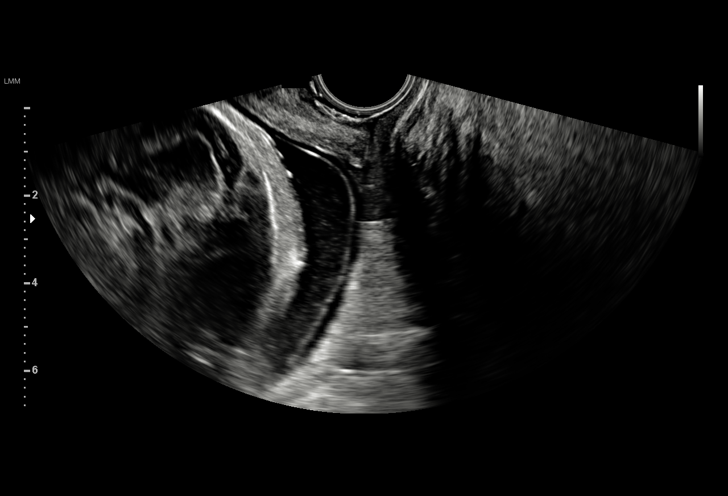
[im 25/27]
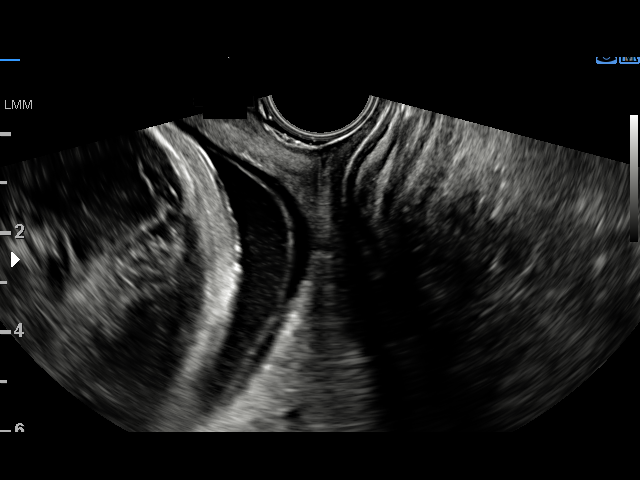
[im 27/27]
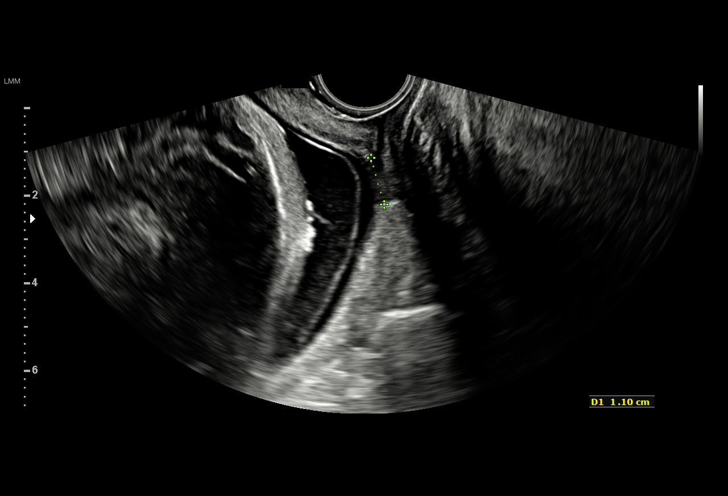

[15 of 27 positions shown; findings below may reference images not displayed]

Attending:        Petero Raasch      Secondary Phy.:    [facility]-
MAU/Triage
ARISSA NP

1  JOBIN MANCILLA           [id number]      [id number]     [id number]
2  JOBIN MANCILLA           [id number]      [id number]     [id number]
Indications

29 weeks gestation of pregnancy
Preterm labor without delivery, third [email or web address]
OB History

Gravidity:    1
Fetal Evaluation

Num Of Fetuses:     1
Fetal Heart         149
Rate(bpm):
Cardiac Activity:   Observed
Presentation:       Cephalic
Placenta:           Posterior, above cervical os

Amniotic Fluid
AFI FV:      Subjectively within normal limits

AFI Sum(cm)     %Tile       Largest Pocket(cm)
17.98           68

RUQ(cm)       RLQ(cm)       LUQ(cm)        LLQ(cm)
2.26
Gestational Age

Clinical EDD:  29w 0d                                        EDD:   07/21/16
Best:          29w 0d     Det. By:  Clinical EDD             EDD:   07/21/16
Cervix Uterus Adnexa

Cervix
Dilated 1.3 cm.

Cul De Sac:   No free fluid seen.
Impression

Single living intrauterine pregnancy at
Normal amniotic fluid volume.
The cervix appears dilated on transvaginal ultrasound.
There is no measurable cervical length.
Recommendations

Follow-up ultrasounds as clinically indicated.

## 2019-04-12 DIAGNOSIS — F988 Other specified behavioral and emotional disorders with onset usually occurring in childhood and adolescence: Secondary | ICD-10-CM | POA: Insufficient documentation

## 2019-04-12 DIAGNOSIS — E559 Vitamin D deficiency, unspecified: Secondary | ICD-10-CM | POA: Insufficient documentation

## 2019-07-21 DIAGNOSIS — F411 Generalized anxiety disorder: Secondary | ICD-10-CM | POA: Insufficient documentation

## 2020-08-24 ENCOUNTER — Encounter (HOSPITAL_BASED_OUTPATIENT_CLINIC_OR_DEPARTMENT_OTHER): Payer: Self-pay

## 2020-08-24 ENCOUNTER — Emergency Department (HOSPITAL_BASED_OUTPATIENT_CLINIC_OR_DEPARTMENT_OTHER)
Admission: EM | Admit: 2020-08-24 | Discharge: 2020-08-25 | Disposition: A | Discharge status: Home / Self Care | Payer: Medicaid Other | Attending: Emergency Medicine | Admitting: Emergency Medicine

## 2020-08-24 ENCOUNTER — Other Ambulatory Visit: Payer: Self-pay

## 2020-08-24 DIAGNOSIS — F419 Anxiety disorder, unspecified: Secondary | ICD-10-CM | POA: Diagnosis present

## 2020-08-24 DIAGNOSIS — M545 Low back pain, unspecified: Secondary | ICD-10-CM | POA: Insufficient documentation

## 2020-08-24 HISTORY — DX: Anxiety disorder, unspecified: F41.9

## 2020-08-24 NOTE — ED Triage Notes (Signed)
Pt reports dizziness and nausea since last night   Pt is anxious - has hx of anxiety

## 2020-08-25 ENCOUNTER — Encounter (HOSPITAL_BASED_OUTPATIENT_CLINIC_OR_DEPARTMENT_OTHER): Payer: Self-pay | Admitting: Emergency Medicine

## 2020-08-25 MED ORDER — LORAZEPAM 1 MG PO TABS
1.0000 mg | ORAL_TABLET | Freq: Once | ORAL | Status: AC
  Administered 2020-08-25: 1 mg via ORAL
  Filled 2020-08-25: qty 1

## 2020-08-25 MED ORDER — LORAZEPAM 1 MG PO TABS: 0.5000 mg | ORAL_TABLET | Freq: Three times a day (TID) | ORAL | 0 refills | Status: DC | PRN
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
## 2020-09-01 DIAGNOSIS — F410 Panic disorder [episodic paroxysmal anxiety] without agoraphobia: Secondary | ICD-10-CM | POA: Insufficient documentation

## 2020-09-25 DIAGNOSIS — Z79899 Other long term (current) drug therapy: Secondary | ICD-10-CM | POA: Insufficient documentation

## 2021-02-20 ENCOUNTER — Other Ambulatory Visit: Payer: Self-pay

## 2021-02-20 ENCOUNTER — Emergency Department (HOSPITAL_BASED_OUTPATIENT_CLINIC_OR_DEPARTMENT_OTHER)
Admission: EM | Admit: 2021-02-20 | Discharge: 2021-02-20 | Disposition: A | Discharge status: Home / Self Care | Payer: Medicaid Other | Attending: Emergency Medicine | Admitting: Emergency Medicine

## 2021-02-20 ENCOUNTER — Encounter (HOSPITAL_BASED_OUTPATIENT_CLINIC_OR_DEPARTMENT_OTHER): Payer: Self-pay | Admitting: Emergency Medicine

## 2021-02-20 DIAGNOSIS — H9202 Otalgia, left ear: Secondary | ICD-10-CM | POA: Diagnosis not present

## 2021-02-20 DIAGNOSIS — Z20822 Contact with and (suspected) exposure to covid-19: Secondary | ICD-10-CM | POA: Insufficient documentation

## 2021-02-20 DIAGNOSIS — J069 Acute upper respiratory infection, unspecified: Secondary | ICD-10-CM | POA: Diagnosis not present

## 2021-02-20 DIAGNOSIS — R059 Cough, unspecified: Secondary | ICD-10-CM | POA: Diagnosis present

## 2021-02-20 LAB — RESP PANEL BY RT-PCR (FLU A&B, COVID) ARPGX2
Influenza A by PCR: NEGATIVE
Influenza B by PCR: NEGATIVE
SARS Coronavirus 2 by RT PCR: NEGATIVE

## 2021-02-20 LAB — GROUP A STREP BY PCR: Group A Strep by PCR: NOT DETECTED

## 2021-02-20 MED ORDER — FLUTICASONE PROPIONATE 50 MCG/ACT NA SUSP: 2.0000 | Freq: Every day | NASAL | 0 refills | Status: DC

## 2021-02-20 MED ORDER — AMOXICILLIN 500 MG PO CAPS: 1000.0000 mg | ORAL_CAPSULE | Freq: Two times a day (BID) | ORAL | 0 refills | Status: AC

## 2021-02-20 MED ORDER — BENZONATATE 100 MG PO CAPS: 100.0000 mg | ORAL_CAPSULE | Freq: Three times a day (TID) | ORAL | 0 refills | Status: DC | PRN

## 2021-02-20 NOTE — ED Notes (Signed)
Pt verbalizes understanding of discharge instructions. Opportunity for questioning and answers were provided. Pt discharged from ED to home.   ? ?

## 2021-02-20 NOTE — ED Provider Notes (Signed)
Emergency Department Provider Note   I have reviewed the triage vital signs and the nursing notes.   HISTORY  Chief Complaint Otalgia and Generalized Body Aches   HPI Yesenia Dawson is a 25 y.o. female with PMH reviewed below presents to the ED with left ear pain/fullness along with body aches.  Symptoms began yesterday.  She reports nasal congestion along with cough and sore throat.  She feels like her voice is diminished as well.  She is been taking Mucinex and tried some drops in the left ear for swimmer's ear but this caused discomfort.  She been trying over-the-counter medications for her sore throat as well.  She is feeling some achiness throughout her body but also in the back of her neck.  No sick contact. No radiation of symptoms or modifying factors. No injury to head/ear. No blood or drainage from the ear.    Past Medical History:  Diagnosis Date   Anxiety     Patient Active Problem List   Diagnosis Date Noted   SVD (spontaneous vaginal delivery) 05/08/2016   Preterm labor in third trimester 05/05/2016    Past Surgical History:  Procedure Laterality Date   NO PAST SURGERIES      Allergies Patient has no known allergies.  Family History  Problem Relation Age of Onset   Hypertension Mother    Hypertension Father    Cancer Other    Diabetes Other     Social History Social History   Tobacco Use   Smoking status: Never   Smokeless tobacco: Never  Substance Use Topics   Alcohol use: Yes   Drug use: No    Review of Systems  Constitutional: No fever/chills Eyes: No visual changes. ENT: Positive sore throat and congestion with left ear pain.  Cardiovascular: Denies chest pain. Respiratory: Denies shortness of breath. Gastrointestinal: No abdominal pain. Musculoskeletal: Positive muscle aches.  Skin: Negative for rash. Neurological: Negative for focal weakness or numbness. Positive HA.   10-point ROS otherwise  negative.  ____________________________________________   PHYSICAL EXAM:  VITAL SIGNS: ED Triage Vitals [02/20/21 0032]  Enc Vitals Group     BP (!) 159/104     Pulse Rate (!) 113     Resp (!) 22     Temp 98.2 F (36.8 C)     Temp Source Oral     SpO2 99 %     Weight 280 lb (127 kg)     Height 5' (1.524 m)   Constitutional: Alert and oriented. Well appearing and in no acute distress. Eyes: Conjunctivae are normal. PERRL. EOMI. Head: Atraumatic. Nose: No congestion/rhinnorhea. Ears: Normal external auditory canals bilaterally.  The left ear has effusion noted with some mild bulging and slight erythema.  Normal TM on the right.  Mouth/Throat: Mucous membranes are moist.  Oropharynx erythematous w/o PTA. Clear voice. Widely patent oropharynx. No trismus.  Neck: No stridor.  No meningeal signs. Cardiovascular: Normal rate, regular rhythm. Good peripheral circulation. Grossly normal heart sounds.   Respiratory: Normal respiratory effort.  No retractions. Lungs CTAB. Gastrointestinal: Soft and nontender. No distention.  Musculoskeletal: No lower extremity tenderness nor edema. No gross deformities of extremities. Neurologic:  Normal speech and language. No gross focal neurologic deficits are appreciated.  Skin:  Skin is warm, dry and intact. No rash noted.  ____________________________________________   LABS (all labs ordered are listed, but only abnormal results are displayed)  Labs Reviewed  GROUP A STREP BY PCR  RESP PANEL BY RT-PCR (FLU A&B,  COVID) [id number]   ____________________________________________  RADIOLOGY  None   ____________________________________________   PROCEDURES  Procedure(s) performed:   Procedures  None  ____________________________________________   INITIAL IMPRESSION / ASSESSMENT AND PLAN / ED COURSE  Pertinent labs & imaging results that were available during my care of the patient were reviewed by me and considered in my medical  decision making (see chart for details).   Patient presents emergency department with upper respiratory infection symptoms, sore throat, left ear pain.  On exam, the patient does have an effusion in the left ear likely causing some of her symptoms.  The oropharynx shows erythema but no exudate or peritonsillar abscess.  No signs on exam to suspect deeper space neck infection.  Plan to cover with amoxicillin given her left ear findings and possibly developing acute otitis media.  Discussed continued supportive care at home for upper respiratory infection along with ED return precautions and PCP follow-up plan. COVID and Flu are negative. Strep negative by PCR here.    ____________________________________________  FINAL CLINICAL IMPRESSION(S) / ED DIAGNOSES  Final diagnoses:  Viral upper respiratory tract infection  Left ear pain     NEW OUTPATIENT MEDICATIONS STARTED DURING THIS VISIT:  Discharge Medication List as of 02/20/2021  1:37 AM     START taking these medications   Details  amoxicillin (AMOXIL) 500 MG capsule Take 2 capsules (1,000 mg total) by mouth 2 (two) times daily for 7 days., Starting Tue 02/20/2021, Until Tue 02/27/2021, Normal    benzonatate (TESSALON) 100 MG capsule Take 1 capsule (100 mg total) by mouth 3 (three) times daily as needed for cough., Starting Tue 02/20/2021, Normal    fluticasone (FLONASE) 50 MCG/ACT nasal spray Place 2 sprays into both nostrils daily for 7 days., Starting Tue 02/20/2021, Until Tue 02/27/2021, Normal        Note:  This document was prepared using Dragon voice recognition software and may include unintentional dictation errors.  Alona Bene, MD, [facility] Emergency [facility]    [name], Arlyss Repress, MD 02/20/21 [id number]

## 2021-02-20 NOTE — Discharge Instructions (Signed)
You were seen in the emergency room today with viral respiratory symptoms.  Your COVID and flu test are negative.  With the fluid behind the left ear I am starting you on antibiotics for the next 7 days.  I have also called in several medications to help with your symptoms of cough and congestion.  Please follow closely with your primary care doctor.  Return to the emergency department any new or suddenly worsening symptoms.

## 2021-02-20 NOTE — ED Triage Notes (Signed)
°  Patient comes in with L ear pain/fullness and body aches. Patient states it has been going on since yesterday morning.  States her son just got over a cold.  No fevers at home.  Also endorses sore throat and dysphagia.  Pain 8/10, achy pain in L ear.  Taking mucinex at home.  Put swimmers ear remedy in L ear but caused pain.  Cepacol drops for sore throat.

## 2021-03-22 ENCOUNTER — Ambulatory Visit
Admission: EM | Admit: 2021-03-22 | Discharge: 2021-03-22 | Disposition: A | Discharge status: Home / Self Care | Payer: Medicaid Other | Attending: Emergency Medicine | Admitting: Emergency Medicine

## 2021-03-22 ENCOUNTER — Other Ambulatory Visit: Payer: Self-pay

## 2021-03-22 ENCOUNTER — Encounter: Payer: Self-pay | Admitting: Emergency Medicine

## 2021-03-22 DIAGNOSIS — J310 Chronic rhinitis: Secondary | ICD-10-CM | POA: Diagnosis not present

## 2021-03-22 DIAGNOSIS — J329 Chronic sinusitis, unspecified: Secondary | ICD-10-CM

## 2021-03-22 DIAGNOSIS — R6883 Chills (without fever): Secondary | ICD-10-CM | POA: Diagnosis not present

## 2021-03-22 DIAGNOSIS — R52 Pain, unspecified: Secondary | ICD-10-CM | POA: Diagnosis not present

## 2021-03-22 DIAGNOSIS — R509 Fever, unspecified: Secondary | ICD-10-CM | POA: Diagnosis not present

## 2021-03-22 DIAGNOSIS — H9203 Otalgia, bilateral: Secondary | ICD-10-CM

## 2021-03-22 MED ORDER — IBUPROFEN 600 MG PO TABS: 600.0000 mg | ORAL_TABLET | Freq: Three times a day (TID) | ORAL | 0 refills | Status: AC | PRN

## 2021-03-22 MED ORDER — FLUTICASONE PROPIONATE 50 MCG/ACT NA SUSP: 2.0000 | Freq: Every day | NASAL | 0 refills | Status: AC

## 2021-03-22 MED ORDER — GUAIFENESIN 400 MG PO TABS: ORAL_TABLET | ORAL | 0 refills | Status: AC

## 2021-03-22 MED ORDER — CETIRIZINE HCL 10 MG PO TABS: 10.0000 mg | ORAL_TABLET | Freq: Every day | ORAL | 2 refills | Status: DC

## 2021-03-22 NOTE — ED Provider Notes (Signed)
[facility]    CSN: [id number] Arrival date & time: 03/22/21  1909    HISTORY   Chief Complaint  Patient presents with   Generalized Body Aches   HPI Yesenia Dawson is a 26 y.o. female. Patient presents to urgent care today complaining of body aches, subjective fever, congestion and cough.  Patient states she has previously been prescribed Flonase in the past however is not taking at this time.  Patient states she has been taking over-the-counter cold flu preparation with no meaningful relief.  Patient states she is concerned she has a flu.  She denies sore throat, productive cough, loss of taste or smell, nausea, vomiting, diarrhea.   Past Medical History:  Diagnosis Date   Anxiety    Patient Active Problem List   Diagnosis Date Noted   SVD (spontaneous vaginal delivery) 05/08/2016   Preterm labor in third trimester 05/05/2016   Past Surgical History:  Procedure Laterality Date   NO PAST SURGERIES     OB History     Gravida  1   Para  1   Term      Preterm  1   AB      Living  1      SAB      IAB      Ectopic      Multiple  0   Live Births  1          Home Medications    Prior to Admission medications   Medication Sig Start Date End Date Taking? Authorizing Provider  cetirizine (ZYRTEC ALLERGY) 10 MG tablet Take 1 tablet (10 mg total) by mouth at bedtime. 03/22/21 06/20/21 Yes Theadora Rama Scales, PA-C  fluticasone (FLONASE) 50 MCG/ACT nasal spray Place 2 sprays into both nostrils daily. 03/22/21  Yes Theadora Rama Scales, PA-C  guaifenesin (HUMIBID E) 400 MG TABS tablet Take 1 tablet 3 times daily as needed for chest congestion and cough 03/22/21  Yes Theadora Rama Scales, PA-C  ibuprofen (ADVIL) 600 MG tablet Take 1 tablet (600 mg total) by mouth every 8 (eight) hours as needed for up to 30 doses for fever, headache, mild pain or moderate pain (Inflammation). Take 1 tablet 3 times daily as needed for inflammation of upper airways  and/or pain. 03/22/21  Yes Theadora Rama Scales, PA-C  Prenatal Vit-Fe Phos-FA-Omega (VITAFOL GUMMIES) 3.33-0.333-34.8 MG CHEW Chew 3 each by mouth daily.    [provider]   Family History Family History  Problem Relation Age of Onset   Hypertension Mother    Hypertension Father    Cancer Other    Diabetes Other    Social History Social History   Tobacco Use   Smoking status: Never   Smokeless tobacco: Never  Substance Use Topics   Alcohol use: Yes   Drug use: No   Allergies   Patient has no known allergies.  Review of Systems Review of Systems Pertinent findings noted in history of present illness.   Physical Exam Triage Vital Signs ED Triage Vitals  Enc Vitals Group     BP 12/22/20 0827 (!) 147/82     Pulse Rate 12/22/20 0827 72     Resp 12/22/20 0827 18     Temp 12/22/20 0827 98.3 F (36.8 C)     Temp Source 12/22/20 0827 Oral     SpO2 12/22/20 0827 98 %     Weight --      Height --      Head Circumference --  Peak Flow --      Pain Score 12/22/20 0826 5     Pain Loc --      Pain Edu? --      Excl. in GC? --   No data found.  Updated Vital Signs BP (!) 158/81 (BP Location: Left Arm)    Pulse (!) 125    Temp 98.6 F (37 C) (Oral)    Resp 18    SpO2 97%   Physical Exam Vitals and nursing note reviewed.  Constitutional:      General: She is not in acute distress.    Appearance: Normal appearance. She is not ill-appearing.  HENT:     Head: Normocephalic and atraumatic.     Salivary Glands: Right salivary gland is not diffusely enlarged or tender. Left salivary gland is not diffusely enlarged or tender.     Right Ear: Ear canal and external ear normal. No drainage. A middle ear effusion is present. There is no impacted cerumen. Tympanic membrane is bulging. Tympanic membrane is not injected or erythematous.     Left Ear: Ear canal and external ear normal. No drainage. A middle ear effusion is present. There is no impacted cerumen. Tympanic  membrane is bulging. Tympanic membrane is not injected or erythematous.     Ears:     Comments: Bilateral EACs normal, both TMs bulging with clear fluid    Nose: Rhinorrhea present. No nasal deformity, septal deviation, signs of injury, nasal tenderness, mucosal edema or congestion. Rhinorrhea is clear.     Right Nostril: Occlusion present. No foreign body, epistaxis or septal hematoma.     Left Nostril: Occlusion present. No foreign body, epistaxis or septal hematoma.     Right Turbinates: Enlarged, swollen and pale.     Left Turbinates: Enlarged, swollen and pale.     Right Sinus: No maxillary sinus tenderness or frontal sinus tenderness.     Left Sinus: No maxillary sinus tenderness or frontal sinus tenderness.     Mouth/Throat:     Lips: Pink. No lesions.     Mouth: Mucous membranes are moist. No oral lesions.     Pharynx: Oropharynx is clear. Uvula midline. No posterior oropharyngeal erythema or uvula swelling.     Tonsils: No tonsillar exudate. 0 on the right. 0 on the left.     Comments: Postnasal drip Eyes:     General: Lids are normal.        Right eye: No discharge.        Left eye: No discharge.     Extraocular Movements: Extraocular movements intact.     Conjunctiva/sclera: Conjunctivae normal.     Right eye: Right conjunctiva is not injected.     Left eye: Left conjunctiva is not injected.  Neck:     Trachea: Trachea and phonation normal.  Cardiovascular:     Rate and Rhythm: Normal rate and regular rhythm.     Pulses: Normal pulses.     Heart sounds: Normal heart sounds. No murmur heard.   No friction rub. No gallop.  Pulmonary:     Effort: Pulmonary effort is normal. No accessory muscle usage, prolonged expiration or respiratory distress.     Breath sounds: Normal breath sounds. No stridor, decreased air movement or transmitted upper airway sounds. No decreased breath sounds, wheezing, rhonchi or rales.  Chest:     Chest wall: No tenderness.  Musculoskeletal:         General: Normal range of motion.     Cervical  back: Normal range of motion and neck supple. Normal range of motion.  Lymphadenopathy:     Cervical: No cervical adenopathy.  Skin:    General: Skin is warm and dry.     Findings: No erythema or rash.  Neurological:     General: No focal deficit present.     Mental Status: She is alert and oriented to person, place, and time.  Psychiatric:        Mood and Affect: Mood normal.        Behavior: Behavior normal.    Visual Acuity Right Eye Distance:   Left Eye Distance:   Bilateral Distance:    Right Eye Near:   Left Eye Near:    Bilateral Near:     UC Couse / Diagnostics / Procedures:    EKG  Radiology No results found.  Procedures Procedures (including critical care time)  UC Diagnoses / Final Clinical Impressions(s)   I have reviewed the triage vital signs and the nursing notes.  Pertinent labs & imaging results that were available during my care of the patient were reviewed by me and considered in my medical decision making (see chart for details).   Final diagnoses:  Generalized body aches  Subjective fever  Chills  Rhinosinusitis   Influenza test is negative, patient advised.  Patient advised to begin on cetirizine and Flonase for rhinosinusitis.  Continue guaifenesin and ibuprofen for respiratory inflammation and congestion.  ED Prescriptions     Medication Sig Dispense Auth. Provider   guaifenesin (HUMIBID E) 400 MG TABS tablet Take 1 tablet 3 times daily as needed for chest congestion and cough 21 tablet Theadora RamaMorgan, Klinton Candelas Scales, PA-C   cetirizine (ZYRTEC ALLERGY) 10 MG tablet Take 1 tablet (10 mg total) by mouth at bedtime. 30 tablet Theadora RamaMorgan, Hildegard Hlavac Scales, PA-C   fluticasone (FLONASE) 50 MCG/ACT nasal spray Place 2 sprays into both nostrils daily. 18 mL Theadora RamaMorgan, Daylani Deblois Scales, PA-C   ibuprofen (ADVIL) 600 MG tablet Take 1 tablet (600 mg total) by mouth every 8 (eight) hours as needed for up to 30 doses for fever,  headache, mild pain or moderate pain (Inflammation). Take 1 tablet 3 times daily as needed for inflammation of upper airways and/or pain. 30 tablet Theadora RamaMorgan, Jaziah Kwasnik Scales, PA-C      PDMP not reviewed this encounter.  Pending results:  Labs Reviewed  POCT INFLUENZA A/B    Medications Ordered in UC: Medications - No data to display  Disposition Upon Discharge:  Condition: stable for discharge home Home: take medications as prescribed; routine discharge instructions as discussed; follow up as advised.  Patient presented with an acute illness with associated systemic symptoms and significant discomfort requiring urgent management. In my opinion, this is a condition that a prudent lay person (someone who possesses an average knowledge of health and medicine) may potentially expect to result in complications if not addressed urgently such as respiratory distress, impairment of bodily function or dysfunction of bodily organs.   Routine symptom specific, illness specific and/or disease specific instructions were discussed with the patient and/or caregiver at length.   As such, the patient has been evaluated and assessed, work-up was performed and treatment was provided in alignment with urgent care protocols and evidence based medicine.  Patient/parent/caregiver has been advised that the patient may require follow up for further testing and treatment if the symptoms continue in spite of treatment, as clinically indicated and appropriate.  If the patient was tested for COVID-19, Influenza and/or RSV, then the patient/parent/guardian  was advised to isolate at home pending the results of his/her diagnostic coronavirus test and potentially longer if theyre positive. I have also advised pt that if his/her COVID-19 test returns positive, it's recommended to self-isolate for at least 10 days after symptoms first appeared AND until fever-free for 24 hours without fever reducer AND other symptoms have  improved or resolved. Discussed self-isolation recommendations as well as instructions for household member/close contacts as per the [facility] and [place] DHHS, and also gave patient the COVID packet with this information.  Patient/parent/caregiver has been advised to [facility] or PCP in 3-5 days if no better; to PCP or the Emergency Department if new signs and symptoms develop, or if the current signs or symptoms continue to change or worsen for further workup, evaluation and treatment as clinically indicated and appropriate  The patient will follow up with their current PCP if and as advised. If the patient does not currently have a PCP we will assist them in obtaining one.   The patient may need specialty follow up if the symptoms continue, in spite of conservative treatment and management, for further workup, evaluation, consultation and treatment as clinically indicated and appropriate.  Patient/parent/caregiver verbalized understanding and agreement of plan as discussed.  All questions were addressed during visit.  Please see discharge instructions below for further details of plan.  Discharge Instructions:   Discharge Instructions      Your influenza test is negative.  I believe you are suffering from a viral respiratory illness that is aggravated some underlying, previously well-tolerated allergies.  Conservative care is recommended at this time.  This includes rest, pushing clear fluids and activity as tolerated.  Warm beverages such as teas and broths versus cold beverages/popsicles and frozen sherbet/sorbet are your choice, both warm and cold are beneficial.  You may also notice that your appetite is reduced; this is okay as long as you are drinking plenty of clear fluids.    Please see the list below for recommended medications, dosages and frequencies to provide relief of your current symptoms:     Ibuprofen  (Advil, Motrin): This is a good anti-inflammatory medication which  addresses aches, pains and inflammation of the upper airways that causes sinus and nasal congestion as well as in the lower airways which makes your cough feel tight and sometimes burn.  I recommend that you take between 400 to 600 mg every 6-8 hours as needed, I have provided you with a prescription for 600 mg.      Fluticasone (Flonase): This is a steroid nasal spray that you use once daily, 1 spray in each nare.  After 3 to 5 days of use, you will have significant improvement of the inflammation and mucus production that is being caused by exposure to allergens.  This medication can be purchased over-the-counter however I have provided you with a prescription.      Cetirizine (Zyrtec): This is an excellent second-generation antihistamine that helps to reduce respiratory inflammatory response to viruses and environmental allergens.  Please take 1 tablet daily at bedtime.   Guaifenesin (Robitussin, Mucinex): This is an expectorant.  This helps break up chest congestion and loosen up thick nasal drainage making phlegm and drainage more liquid and therefore easier to remove.  I recommend being 400 mg three times daily as needed.      Please remain home from work, school, public places until you have been fever free for 24 hours without the use of antifever medications such as  Tylenol or ibuprofen.    Please follow-up within the next 3 to 5 days either with your primary care provider or urgent care if your symptoms do not resolve.  If you do not have a primary care provider, we will assist you in finding one.     This office note has been dictated using Teaching laboratory technician.  Unfortunately, and despite my best efforts, this method of dictation can sometimes lead to occasional typographical or grammatical errors.  I apologize in advance if this occurs.     Theadora Rama Scales, PA-C 03/22/21 2015

## 2021-03-22 NOTE — Discharge Instructions (Addendum)
Your influenza test is negative.  I believe you are suffering from a viral respiratory illness that is aggravated some underlying, previously well-tolerated allergies.  Conservative care is recommended at this time.  This includes rest, pushing clear fluids and activity as tolerated.  Warm beverages such as teas and broths versus cold beverages/popsicles and frozen sherbet/sorbet are your choice, both warm and cold are beneficial.  You may also notice that your appetite is reduced; this is okay as long as you are drinking plenty of clear fluids.    Please see the list below for recommended medications, dosages and frequencies to provide relief of your current symptoms:     Ibuprofen  (Advil, Motrin): This is a good anti-inflammatory medication which addresses aches, pains and inflammation of the upper airways that causes sinus and nasal congestion as well as in the lower airways which makes your cough feel tight and sometimes burn.  I recommend that you take between 400 to 600 mg every 6-8 hours as needed, I have provided you with a prescription for 600 mg.      Fluticasone (Flonase): This is a steroid nasal spray that you use once daily, 1 spray in each nare.  After 3 to 5 days of use, you will have significant improvement of the inflammation and mucus production that is being caused by exposure to allergens.  This medication can be purchased over-the-counter however I have provided you with a prescription.      Cetirizine (Zyrtec): This is an excellent second-generation antihistamine that helps to reduce respiratory inflammatory response to viruses and environmental allergens.  Please take 1 tablet daily at bedtime.   Guaifenesin (Robitussin, Mucinex): This is an expectorant.  This helps break up chest congestion and loosen up thick nasal drainage making phlegm and drainage more liquid and therefore easier to remove.  I recommend being 400 mg three times daily as needed.      Please remain home from  work, school, public places until you have been fever free for 24 hours without the use of antifever medications such as Tylenol or ibuprofen.    Please follow-up within the next 3 to 5 days either with your primary care provider or urgent care if your symptoms do not resolve.  If you do not have a primary care provider, we will assist you in finding one.

## 2021-03-22 NOTE — ED Triage Notes (Signed)
Virus going around her son's class. Symptoms on-going x 3 days. Has been managing with OTC meds. C/o nasal congestion, body aches, cough with minor mucus production, minor wheezing (denies asthma hx), chills. Pt is morbidly obese. Has been taking mucinex all in one every 4 hours today - includes tylenol, phenylephrine, guiafenesin, and dextromethorphan.

## 2021-07-27 DIAGNOSIS — E119 Type 2 diabetes mellitus without complications: Secondary | ICD-10-CM | POA: Insufficient documentation

## 2021-12-10 DIAGNOSIS — R030 Elevated blood-pressure reading, without diagnosis of hypertension: Secondary | ICD-10-CM | POA: Insufficient documentation

## 2021-12-10 DIAGNOSIS — R7401 Elevation of levels of liver transaminase levels: Secondary | ICD-10-CM | POA: Insufficient documentation

## 2021-12-25 ENCOUNTER — Other Ambulatory Visit (HOSPITAL_BASED_OUTPATIENT_CLINIC_OR_DEPARTMENT_OTHER): Payer: Self-pay

## 2021-12-25 MED ORDER — OZEMPIC (2 MG/DOSE) 8 MG/3ML ~~LOC~~ SOPN
2.0000 mg | PEN_INJECTOR | SUBCUTANEOUS | 2 refills | Status: DC
  Filled 2021-12-25: qty 3, 28d supply, fill #0

## 2021-12-26 ENCOUNTER — Other Ambulatory Visit (HOSPITAL_BASED_OUTPATIENT_CLINIC_OR_DEPARTMENT_OTHER): Payer: Self-pay

## 2022-01-21 ENCOUNTER — Other Ambulatory Visit (HOSPITAL_BASED_OUTPATIENT_CLINIC_OR_DEPARTMENT_OTHER): Payer: Self-pay

## 2022-01-21 MED ORDER — OZEMPIC (2 MG/DOSE) 8 MG/3ML ~~LOC~~ SOPN
PEN_INJECTOR | SUBCUTANEOUS | 2 refills | Status: DC
  Filled 2022-01-21: qty 3, 28d supply, fill #0

## 2022-01-21 MED ORDER — ALPRAZOLAM 0.5 MG PO TABS
0.5000 mg | ORAL_TABLET | Freq: Every day | ORAL | 0 refills | Status: DC
  Filled 2022-01-21: qty 30, 30d supply, fill #0

## 2022-01-21 MED ORDER — NYSTATIN 100000 UNIT/GM EX POWD
CUTANEOUS | 2 refills | Status: DC
  Filled 2022-01-21: qty 15, 30d supply, fill #0

## 2022-02-08 ENCOUNTER — Other Ambulatory Visit: Payer: Self-pay

## 2022-02-08 ENCOUNTER — Emergency Department (HOSPITAL_BASED_OUTPATIENT_CLINIC_OR_DEPARTMENT_OTHER)
Admission: EM | Admit: 2022-02-08 | Discharge: 2022-02-09 | Disposition: A | Discharge status: Home / Self Care | Payer: Medicaid Other | Attending: Emergency Medicine | Admitting: Emergency Medicine

## 2022-02-08 ENCOUNTER — Encounter (HOSPITAL_BASED_OUTPATIENT_CLINIC_OR_DEPARTMENT_OTHER): Payer: Self-pay | Admitting: Emergency Medicine

## 2022-02-08 DIAGNOSIS — Y92410 Unspecified street and highway as the place of occurrence of the external cause: Secondary | ICD-10-CM | POA: Insufficient documentation

## 2022-02-08 DIAGNOSIS — R109 Unspecified abdominal pain: Secondary | ICD-10-CM | POA: Diagnosis not present

## 2022-02-08 DIAGNOSIS — S20301A Unspecified superficial injuries of right front wall of thorax, initial encounter: Secondary | ICD-10-CM | POA: Diagnosis present

## 2022-02-08 DIAGNOSIS — S20211A Contusion of right front wall of thorax, initial encounter: Secondary | ICD-10-CM | POA: Diagnosis not present

## 2022-02-08 DIAGNOSIS — E119 Type 2 diabetes mellitus without complications: Secondary | ICD-10-CM | POA: Diagnosis not present

## 2022-02-08 HISTORY — DX: Type 2 diabetes mellitus without complications: E11.9
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
## 2022-02-09 ENCOUNTER — Emergency Department (HOSPITAL_BASED_OUTPATIENT_CLINIC_OR_DEPARTMENT_OTHER): Payer: Medicaid Other

## 2022-02-09 ENCOUNTER — Emergency Department (HOSPITAL_BASED_OUTPATIENT_CLINIC_OR_DEPARTMENT_OTHER): Payer: Medicaid Other | Admitting: Radiology

## 2022-02-09 LAB — CBC WITH DIFFERENTIAL/PLATELET
Abs Immature Granulocytes: 0.03 10*3/uL (ref 0.00–0.07)
Basophils Absolute: 0 10*3/uL (ref 0.0–0.1)
Basophils Relative: 0 %
Eosinophils Absolute: 0.2 10*3/uL (ref 0.0–0.5)
Eosinophils Relative: 2 %
HCT: 42 % (ref 36.0–46.0)
Hemoglobin: 13.6 g/dL (ref 12.0–15.0)
Immature Granulocytes: 0 %
Lymphocytes Relative: 31 %
Lymphs Abs: 3.8 10*3/uL (ref 0.7–4.0)
MCH: 28.3 pg (ref 26.0–34.0)
MCHC: 32.4 g/dL (ref 30.0–36.0)
MCV: 87.5 fL (ref 80.0–100.0)
Monocytes Absolute: 0.7 10*3/uL (ref 0.1–1.0)
Monocytes Relative: 6 %
Neutro Abs: 7.2 10*3/uL (ref 1.7–7.7)
Neutrophils Relative %: 61 %
Platelets: 338 10*3/uL (ref 150–400)
RBC: 4.8 MIL/uL (ref 3.87–5.11)
RDW: 14.2 % (ref 11.5–15.5)
WBC: 11.9 10*3/uL — ABNORMAL HIGH (ref 4.0–10.5)
nRBC: 0 % (ref 0.0–0.2)

## 2022-02-09 LAB — COMPREHENSIVE METABOLIC PANEL
ALT: 32 U/L (ref 0–44)
AST: 21 U/L (ref 15–41)
Albumin: 4.6 g/dL (ref 3.5–5.0)
Alkaline Phosphatase: 50 U/L (ref 38–126)
Anion gap: 9 (ref 5–15)
BUN: 12 mg/dL (ref 6–20)
CO2: 25 mmol/L (ref 22–32)
Calcium: 9.4 mg/dL (ref 8.9–10.3)
Chloride: 103 mmol/L (ref 98–111)
Creatinine, Ser: 0.76 mg/dL (ref 0.44–1.00)
GFR, Estimated: >60 mL/min (ref >60)
Glucose, Bld: 81 mg/dL (ref 70–99)
Potassium: 3.8 mmol/L (ref 3.5–5.1)
Sodium: 137 mmol/L (ref 135–145)
Total Bilirubin: 0.5 mg/dL (ref 0.3–1.2)
Total Protein: 7.8 g/dL (ref 6.5–8.1)

## 2022-02-09 LAB — LIPASE, BLOOD: Lipase: 26 U/L (ref 11–51)

## 2022-02-09 LAB — HCG, SERUM, QUALITATIVE: Preg, Serum: NEGATIVE

## 2022-02-09 MED ORDER — HYDROCODONE-ACETAMINOPHEN 5-325 MG PO TABS
1.0000 | ORAL_TABLET | Freq: Once | ORAL | Status: AC
  Administered 2022-02-09: 1 via ORAL
  Filled 2022-02-09: qty 1

## 2022-02-09 MED ORDER — CYCLOBENZAPRINE HCL 5 MG PO TABS: 5.0000 mg | ORAL_TABLET | Freq: Two times a day (BID) | ORAL | 0 refills | Status: AC | PRN

## 2022-02-09 MED ORDER — KETOROLAC TROMETHAMINE 30 MG/ML IJ SOLN
30.0000 mg | Freq: Once | INTRAMUSCULAR | Status: AC
  Administered 2022-02-09: 30 mg via INTRAMUSCULAR
  Filled 2022-02-09: qty 1

## 2022-02-09 MED ORDER — IBUPROFEN 600 MG PO TABS: 600.0000 mg | ORAL_TABLET | Freq: Four times a day (QID) | ORAL | 0 refills | Status: AC | PRN
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
## 2022-02-15 ENCOUNTER — Other Ambulatory Visit (HOSPITAL_BASED_OUTPATIENT_CLINIC_OR_DEPARTMENT_OTHER): Payer: Self-pay

## 2022-02-15 MED ORDER — NYSTATIN 100000 UNIT/GM EX POWD
CUTANEOUS | 2 refills | Status: DC
  Filled 2022-02-15: qty 15, 30d supply, fill #0

## 2022-02-19 ENCOUNTER — Other Ambulatory Visit (HOSPITAL_BASED_OUTPATIENT_CLINIC_OR_DEPARTMENT_OTHER): Payer: Self-pay

## 2022-02-19 MED ORDER — OZEMPIC (2 MG/DOSE) 8 MG/3ML ~~LOC~~ SOPN
2.0000 mg | PEN_INJECTOR | SUBCUTANEOUS | 2 refills | Status: DC
  Filled 2022-02-19: qty 3, 28d supply, fill #0

## 2022-02-20 ENCOUNTER — Other Ambulatory Visit: Payer: Self-pay

## 2022-02-20 ENCOUNTER — Other Ambulatory Visit (HOSPITAL_BASED_OUTPATIENT_CLINIC_OR_DEPARTMENT_OTHER): Payer: Self-pay

## 2022-02-20 MED ORDER — ALPRAZOLAM 0.5 MG PO TABS
0.5000 mg | ORAL_TABLET | Freq: Every day | ORAL | 2 refills | Status: DC | PRN
  Filled 2022-02-20: qty 30, 30d supply, fill #0

## 2022-02-21 ENCOUNTER — Other Ambulatory Visit: Payer: Self-pay

## 2022-02-21 ENCOUNTER — Other Ambulatory Visit (HOSPITAL_BASED_OUTPATIENT_CLINIC_OR_DEPARTMENT_OTHER): Payer: Self-pay

## 2022-03-11 ENCOUNTER — Other Ambulatory Visit: Payer: Self-pay

## 2022-03-11 ENCOUNTER — Other Ambulatory Visit (HOSPITAL_BASED_OUTPATIENT_CLINIC_OR_DEPARTMENT_OTHER): Payer: Self-pay

## 2022-03-11 MED ORDER — BUSPIRONE HCL 10 MG PO TABS
20.0000 mg | ORAL_TABLET | Freq: Every day | ORAL | 1 refills | Status: AC | PRN
  Filled 2022-03-11: qty 60, 30d supply, fill #0

## 2022-03-15 ENCOUNTER — Other Ambulatory Visit (HOSPITAL_BASED_OUTPATIENT_CLINIC_OR_DEPARTMENT_OTHER): Payer: Self-pay

## 2022-03-15 MED ORDER — NYSTATIN 100000 UNIT/GM EX POWD
1.0000 | Freq: Three times a day (TID) | CUTANEOUS | 2 refills | Status: DC
  Filled 2022-03-15: qty 15, 5d supply, fill #0

## 2022-03-15 MED ORDER — OZEMPIC (2 MG/DOSE) 8 MG/3ML ~~LOC~~ SOPN
2.0000 mg | PEN_INJECTOR | SUBCUTANEOUS | 2 refills | Status: DC
  Filled 2022-03-15: qty 3, 28d supply, fill #0

## 2022-03-22 ENCOUNTER — Other Ambulatory Visit (HOSPITAL_BASED_OUTPATIENT_CLINIC_OR_DEPARTMENT_OTHER): Payer: Self-pay

## 2022-03-22 MED ORDER — ALPRAZOLAM 0.5 MG PO TABS
0.5000 mg | ORAL_TABLET | Freq: Every day | ORAL | 2 refills | Status: DC | PRN
  Filled 2022-03-22: qty 30, 30d supply, fill #0

## 2022-03-23 ENCOUNTER — Other Ambulatory Visit (HOSPITAL_BASED_OUTPATIENT_CLINIC_OR_DEPARTMENT_OTHER): Payer: Self-pay

## 2022-04-16 ENCOUNTER — Other Ambulatory Visit (HOSPITAL_BASED_OUTPATIENT_CLINIC_OR_DEPARTMENT_OTHER): Payer: Self-pay

## 2022-04-16 MED ORDER — OZEMPIC (2 MG/DOSE) 8 MG/3ML ~~LOC~~ SOPN
PEN_INJECTOR | SUBCUTANEOUS | 2 refills | Status: DC
  Filled 2022-04-16: qty 3, 28d supply, fill #0

## 2022-10-22 DIAGNOSIS — G44229 Chronic tension-type headache, not intractable: Secondary | ICD-10-CM | POA: Insufficient documentation

## 2022-12-17 ENCOUNTER — Ambulatory Visit: Payer: Medicaid Other | Admitting: Obstetrics and Gynecology

## 2022-12-17 ENCOUNTER — Encounter: Payer: Self-pay | Admitting: Obstetrics and Gynecology

## 2022-12-17 ENCOUNTER — Other Ambulatory Visit (HOSPITAL_COMMUNITY)
Admission: RE | Admit: 2022-12-17 | Discharge: 2022-12-17 | Disposition: A | Discharge status: Home / Self Care | Payer: Medicaid Other | Source: Ambulatory Visit | Attending: Obstetrics and Gynecology | Admitting: Obstetrics and Gynecology

## 2022-12-17 VITALS — BP 127/85 | HR 86 | Ht 60.0 in | Wt 243.0 lb

## 2022-12-17 DIAGNOSIS — R87610 Atypical squamous cells of undetermined significance on cytologic smear of cervix (ASC-US): Secondary | ICD-10-CM

## 2022-12-17 DIAGNOSIS — R87810 Cervical high risk human papillomavirus (HPV) DNA test positive: Secondary | ICD-10-CM

## 2022-12-17 DIAGNOSIS — Z1339 Encounter for screening examination for other mental health and behavioral disorders: Secondary | ICD-10-CM | POA: Diagnosis not present

## 2022-12-17 DIAGNOSIS — Z3202 Encounter for pregnancy test, result negative: Secondary | ICD-10-CM

## 2022-12-17 LAB — POCT URINE PREGNANCY: Preg Test, Ur: NEGATIVE

## 2022-12-17 NOTE — Progress Notes (Signed)
27 yo P1 referred for colposcopy due to abnormal pap smear. Patient with ASCUS pap smear +HPV 08/2022. Patient is without any complaints and admits to being anxious regarding the procedure  Past Medical History:  Diagnosis Date   Anxiety    Diabetes (HCC)    Past Surgical History:  Procedure Laterality Date   NO PAST SURGERIES     Family History  Problem Relation Age of Onset   Hypertension Mother    Hypertension Father    Cancer Other    Diabetes Other    Social History   Tobacco Use   Smoking status: Never   Smokeless tobacco: Never  Substance Use Topics   Alcohol use: Yes   Drug use: No   ROS See pertinent in HPI. All other systems reviewed and non contributory Blood pressure 127/85, pulse 86, height 5' (1.524 m), weight 243 lb (110.2 kg), last menstrual period 12/10/2022, unknown if currently breastfeeding.  GENERAL: Well-developed, well-nourished female in no acute distress.  ABDOMEN: Soft, nontender, nondistended. No organomegaly. PELVIC: Normal external female genitalia. Vagina is pink and rugated.  Normal discharge. Normal appearing cervix. Uterus is normal in size. No adnexal mass or tenderness. Chaperone present during the pelvic exam EXTREMITIES: No cyanosis, clubbing, or edema, 2+ distal pulses.  A/P 27 yo with ASCUS + HPV here for colposcopy - Discussed benefits of Gardasil vaccine which patient already received - Patient given informed consent, signed copy in the chart, time out was performed.  Placed in lithotomy position. Cervix viewed with speculum and colposcope after application of acetic acid.   Colposcopy adequate?  yes Acetowhite lesions?no Punctation?no Mosaicism?  no Abnormal vasculature?  no Biopsies?no ECC?yess  COMMENTS: Patient was given post procedure instructions.  She will return in 2 weeks for results.  Catalina Antigua, MD

## 2022-12-17 NOTE — Progress Notes (Signed)
New GYN presents for COLPO.  PAP is +HPV and ASCUS  UPT  is Negative.

## 2022-12-18 LAB — SURGICAL PATHOLOGY

## 2023-06-18 ENCOUNTER — Encounter: Payer: Self-pay | Admitting: Emergency Medicine

## 2023-06-18 ENCOUNTER — Ambulatory Visit: Admission: EM | Admit: 2023-06-18 | Discharge: 2023-06-18 | Disposition: A | Discharge status: Home / Self Care

## 2023-06-18 DIAGNOSIS — H1012 Acute atopic conjunctivitis, left eye: Secondary | ICD-10-CM

## 2023-06-18 MED ORDER — OLOPATADINE HCL 0.1 % OP SOLN: 1.0000 [drp] | Freq: Two times a day (BID) | OPHTHALMIC | 12 refills | Status: AC | PRN

## 2023-06-18 MED ORDER — CETIRIZINE HCL 10 MG PO TABS: 10.0000 mg | ORAL_TABLET | Freq: Every day | ORAL | 0 refills | Status: AC

## 2023-06-18 NOTE — ED Triage Notes (Signed)
 Pt report L eye is very itchy with excessive watering x2 days. No vision changes. No relief with allergy meds, warm compresses,  or systane eye drops. No known sick contacts. Slight redness in outer corner.

## 2023-06-18 NOTE — ED Provider Notes (Signed)
 [facility]    CSN: [id number] Arrival date & time: 06/18/23  1848      History   Chief Complaint Chief Complaint  Patient presents with   itchy, watery eye    HPI Yesenia Dawson is a 28 y.o. female.   Patient presents to clinic over concerns of left eye itching with a watery discharge that has been present for the past 2 days.  She has not had any vision changes.  She has tried Aleve-D at home without much improvement.  Reports a history of seasonal allergies in relation to pollen.  Wears glasses, does not wear contacts.  That has been very itchy.  Feels the most itchy at the outer corner.  The history is provided by the patient and medical records.    Past Medical History:  Diagnosis Date   Anxiety    Chronic kidney disease    Diabetes (HCC)    Dyspnea    Preterm labor    Vaginal Pap smear, abnormal     Patient Active Problem List   Diagnosis Date Noted   Chronic tension-type headache, not intractable 10/22/2022   Elevated ALT measurement 12/10/2021   White coat syndrome with high blood pressure without hypertension 12/10/2021   Type 2 diabetes mellitus without complication, without long-term current use of insulin (HCC) 07/27/2021   Benzodiazepine use agreement exists 09/25/2020   Panic attacks 09/01/2020   GAD (generalized anxiety disorder) 07/21/2019   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   Vitamin D deficiency 04/12/2019   SVD (spontaneous vaginal delivery) 05/08/2016   Preterm labor with preterm delivery 05/05/2016   Obesity affecting pregnancy, antepartum 01/10/2016   Carrier of genetic disorder 12/13/2015   Pregnancy 12/13/2015   Essential (primary) hypertension 05/08/2010    Past Surgical History:  Procedure Laterality Date   NO PAST SURGERIES      OB History     Gravida  3   Para  1   Term      Preterm  1   AB  2   Living  1      SAB      IAB      Ectopic      Multiple  0   Live  Births  1            Home Medications    Prior to Admission medications   Medication Sig Start Date End Date Taking? Authorizing Provider  cetirizine  (ZYRTEC ) 10 MG tablet Take 1 tablet (10 mg total) by mouth daily. 06/18/23  Yes Verner Kopischke  N, FNP  dicyclomine (BENTYL) 10 MG capsule Take 10 mg by mouth. Patient not taking: Reported on 06/18/2023 12/25/22   [provider]  Dulaglutide 1.5 MG/0.5ML SOAJ Inject 1.5 mg into the skin. Patient not taking: Reported on 06/18/2023 05/28/23   [provider]  nitrofurantoin , macrocrystal-monohydrate, (MACROBID ) 100 MG capsule Take 100 mg by mouth 2 (two) times daily. Patient not taking: Reported on 06/18/2023 03/05/23   [provider]  olopatadine  (PATADAY ) 0.1 % ophthalmic solution Place 1 drop into both eyes 2 (two) times daily as needed for allergies. 06/18/23  Yes Nicha Hemann  N, FNP  ondansetron  (ZOFRAN -ODT) 8 MG disintegrating tablet Take 8 mg by mouth. Patient not taking: Reported on 06/18/2023 12/25/22   [provider]  phenazopyridine (PYRIDIUM) 200 MG tablet Take 200 mg by mouth 2 (two) times daily. Patient not taking: Reported on 06/18/2023 03/05/23   [provider]  rizatriptan (MAXALT) 10 MG  tablet Take 10 mg by mouth. Patient not taking: Reported on 06/18/2023 10/22/22   [provider]  tirzepatide [facility]) 7.5 MG/0.5ML Pen Inject 7.5 mg into the skin. 04/18/23  Yes [provider]  Vitamin D, Ergocalciferol, (DRISDOL) 1.25 MG (50000 UNIT) CAPS capsule Take 50,000 Units by mouth. Patient not taking: Reported on 06/18/2023 09/25/22   [provider]  amphetamine-dextroamphetamine (ADDERALL) 20 MG tablet Take 20 mg by mouth 2 (two) times daily.   Yes [provider]  busPIRone  (BUSPAR ) 10 MG tablet Take 2 tablets (20 mg total) by mouth daily as needed. Patient not taking: Reported on 12/17/2022 03/11/22     cyclobenzaprine  (FLEXERIL ) 5 MG tablet Take 1  tablet (5 mg total) by mouth 2 (two) times daily as needed for muscle spasms. Patient not taking: Reported on 12/17/2022 02/09/22   Horton, Vonzella Guernsey, MD  Doxylamine-Pyridoxine 10-10 MG TBEC     [provider]  ESTARYLLA 0.25-35 MG-MCG tablet Take 1 tablet by mouth daily.    [provider]  fluticasone  (FLONASE ) 50 MCG/ACT nasal spray Place 2 sprays into both nostrils daily. Patient not taking: Reported on 12/17/2022 03/22/21   Eloise Hake Scales, PA-C  guaifenesin  (HUMIBID E) 400 MG TABS tablet Take 1 tablet 3 times daily as needed for chest congestion and cough Patient not taking: Reported on 12/17/2022 03/22/21   Eloise Hake Scales, PA-C  ibuprofen  (ADVIL ) 600 MG tablet Take 1 tablet (600 mg total) by mouth every 8 (eight) hours as needed for up to 30 doses for fever, headache, mild pain or moderate pain (Inflammation). Take 1 tablet 3 times daily as needed for inflammation of upper airways and/or pain. Patient not taking: Reported on 12/17/2022 03/22/21   Eloise Hake Scales, PA-C  ibuprofen  (ADVIL ) 600 MG tablet Take 1 tablet (600 mg total) by mouth every 6 (six) hours as needed. Patient not taking: Reported on 12/17/2022 02/09/22   Horton, Vonzella Guernsey, MD  Prenatal Vit-Fe Phos-FA-Omega (VITAFOL GUMMIES) 3.33-0.333-34.8 MG CHEW Chew 3 each by mouth daily. Patient not taking: Reported on 12/17/2022    [provider]  VENTOLIN HFA 108 (90 Base) MCG/ACT inhaler Inhale 2 puffs into the lungs every 4 (four) hours as needed.    [provider]    Family History Family History  Problem Relation Age of Onset   Hypertension Mother    Cancer Father    Hypertension Father    Cancer Maternal Grandfather    Cancer Paternal Grandmother    Cancer Maternal Aunt    Cancer Other    Diabetes Other     Social History Social History   Tobacco Use   Smoking status: Never   Smokeless tobacco: Never  Vaping Use   Vaping status: Never Used  Substance  Use Topics   Alcohol use: Yes   Drug use: No     Allergies   Buspirone  and Metformin and related   Review of Systems Review of Systems  Per HPI  Physical Exam Triage Vital Signs ED Triage Vitals  Encounter Vitals Group     BP 06/18/23 1909 114/81     Systolic BP Percentile --      Diastolic BP Percentile --      Pulse Rate 06/18/23 1909 88     Resp 06/18/23 1909 14     Temp 06/18/23 1909 98.6 F (37 C)     Temp Source 06/18/23 1909 Oral     SpO2 06/18/23 1909 97 %  Weight --      Height --      Head Circumference --      Peak Flow --      Pain Score 06/18/23 1910 5     Pain Loc --      Pain Education --      Exclude from Growth Chart --    No data found.  Updated Vital Signs BP 114/81 (BP Location: Left Arm)   Pulse 88   Temp 98.6 F (37 C) (Oral)   Resp 14   LMP 05/29/2023 (Approximate)   SpO2 97%   Breastfeeding No   Visual Acuity Right Eye Distance:   Left Eye Distance:   Bilateral Distance:    Right Eye Near:   Left Eye Near:    Bilateral Near:     Physical Exam Vitals and nursing note reviewed.  Constitutional:      Appearance: Normal appearance.  HENT:     Head: Normocephalic and atraumatic.     Right Ear: External ear normal.     Left Ear: External ear normal.     Nose: Nose normal.     Mouth/Throat:     Mouth: Mucous membranes are moist.  Eyes:     General: Lids are normal. Lids are everted, no foreign bodies appreciated. Vision grossly intact. Gaze aligned appropriately.        Left eye: Discharge present.    Extraocular Movements: Extraocular movements intact.     Pupils: Pupils are equal, round, and reactive to light.     Comments: Watery discharge from left eye   Cardiovascular:     Rate and Rhythm: Normal rate.  Pulmonary:     Effort: Pulmonary effort is normal. No respiratory distress.  Musculoskeletal:        General: Normal range of motion.  Skin:    General: Skin is warm and dry.  Neurological:     General: No  focal deficit present.     Mental Status: She is alert.      UC Treatments / Results  Labs (all labs ordered are listed, but only abnormal results are displayed) Labs Reviewed - No data to display  EKG   Radiology No results found.  Procedures Procedures (including critical care time)  Medications Ordered in UC Medications - No data to display  Initial Impression / Assessment and Plan / UC Course  I have reviewed the triage vital signs and the nursing notes.  Pertinent labs & imaging results that were available during my care of the patient were reviewed by me and considered in my medical decision making (see chart for details).  Vitals in triage reviewed, patient is hemodynamically stable.  Left eye with conjunctival irritation at the outer conjunctivo-.  Watery discharge. Symptoms consistent with allergic conjunctivitis, advised daily antihistamine as well as antihistamine eyedrop.  Plan of care, follow-up care return precautions given, no questions at this time.     Final Clinical Impressions(s) / UC Diagnoses   Final diagnoses:  Allergic conjunctivitis of left eye     Discharge Instructions      Use the allergy eyedrops up to 2 times daily as needed for itchy or watery discharge.  Start a daily antihistamine such as cetirizine  and continue this throughout your allergy season.  Return to clinic for any new or urgent symptoms.  Come back if you have yellow/goopy discharge or your eyes are matted shut in the morning as we would need to change how we treat this.  ED Prescriptions     Medication Sig Dispense Auth. Provider   cetirizine  (ZYRTEC ) 10 MG tablet Take 1 tablet (10 mg total) by mouth daily. 30 tablet Harlow Lighter, Sade Mehlhoff  N, FNP   olopatadine  (PATADAY ) 0.1 % ophthalmic solution Place 1 drop into both eyes 2 (two) times daily as needed for allergies. 5 mL Harlow Lighter, Symone Cornman  N, FNP      PDMP not reviewed this encounter.   Harlow Lighter, Bralynn Velador  N,  FNP 06/18/23 1943

## 2023-06-18 NOTE — Discharge Instructions (Signed)
 Use the allergy eyedrops up to 2 times daily as needed for itchy or watery discharge.  Start a daily antihistamine such as cetirizine  and continue this throughout your allergy season.  Return to clinic for any new or urgent symptoms.  Come back if you have yellow/goopy discharge or your eyes are matted shut in the morning as we would need to change how we treat this.

## 2024-01-22 ENCOUNTER — Encounter (HOSPITAL_BASED_OUTPATIENT_CLINIC_OR_DEPARTMENT_OTHER): Payer: Self-pay | Admitting: Emergency Medicine

## 2024-01-22 ENCOUNTER — Other Ambulatory Visit: Payer: Self-pay

## 2024-01-22 ENCOUNTER — Emergency Department (HOSPITAL_BASED_OUTPATIENT_CLINIC_OR_DEPARTMENT_OTHER)
Admission: EM | Admit: 2024-01-22 | Discharge: 2024-01-22 | Disposition: A | Discharge status: Home / Self Care | Attending: Emergency Medicine | Admitting: Emergency Medicine

## 2024-01-22 ENCOUNTER — Emergency Department (HOSPITAL_BASED_OUTPATIENT_CLINIC_OR_DEPARTMENT_OTHER)

## 2024-01-22 DIAGNOSIS — N201 Calculus of ureter: Secondary | ICD-10-CM

## 2024-01-22 DIAGNOSIS — R109 Unspecified abdominal pain: Secondary | ICD-10-CM | POA: Diagnosis present

## 2024-01-22 DIAGNOSIS — Z79899 Other long term (current) drug therapy: Secondary | ICD-10-CM | POA: Diagnosis not present

## 2024-01-22 DIAGNOSIS — D72829 Elevated white blood cell count, unspecified: Secondary | ICD-10-CM | POA: Insufficient documentation

## 2024-01-22 DIAGNOSIS — N132 Hydronephrosis with renal and ureteral calculous obstruction: Secondary | ICD-10-CM | POA: Insufficient documentation

## 2024-01-22 LAB — CBC WITH DIFFERENTIAL/PLATELET
Abs Immature Granulocytes: 0.03 K/uL (ref 0.00–0.07)
Basophils Absolute: 0.1 K/uL (ref 0.0–0.1)
Basophils Relative: 0 %
Eosinophils Absolute: 0.1 K/uL (ref 0.0–0.5)
Eosinophils Relative: 0 %
HCT: 36.5 % (ref 36.0–46.0)
Hemoglobin: 12.1 g/dL (ref 12.0–15.0)
Immature Granulocytes: 0 %
Lymphocytes Relative: 17 %
Lymphs Abs: 2 K/uL (ref 0.7–4.0)
MCH: 29.3 pg (ref 26.0–34.0)
MCHC: 33.2 g/dL (ref 30.0–36.0)
MCV: 88.4 fL (ref 80.0–100.0)
Monocytes Absolute: 0.5 K/uL (ref 0.1–1.0)
Monocytes Relative: 4 %
Neutro Abs: 9.3 K/uL — ABNORMAL HIGH (ref 1.7–7.7)
Neutrophils Relative %: 79 %
Platelets: 326 K/uL (ref 150–400)
RBC: 4.13 MIL/uL (ref 3.87–5.11)
RDW: 12.9 % (ref 11.5–15.5)
WBC: 11.9 K/uL — ABNORMAL HIGH (ref 4.0–10.5)
nRBC: 0 % (ref 0.0–0.2)

## 2024-01-22 LAB — URINALYSIS, ROUTINE W REFLEX MICROSCOPIC
Bilirubin Urine: NEGATIVE
Glucose, UA: NEGATIVE mg/dL
Ketones, ur: NEGATIVE mg/dL
Nitrite: NEGATIVE
Specific Gravity, Urine: 1.024 (ref 1.005–1.030)
pH: 6 (ref 5.0–8.0)

## 2024-01-22 LAB — COMPREHENSIVE METABOLIC PANEL WITH GFR
ALT: 26 U/L (ref 0–44)
AST: 23 U/L (ref 15–41)
Albumin: 4.7 g/dL (ref 3.5–5.0)
Alkaline Phosphatase: 47 U/L (ref 38–126)
Anion gap: 12 (ref 5–15)
BUN: 9 mg/dL (ref 6–20)
CO2: 26 mmol/L (ref 22–32)
Calcium: 10 mg/dL (ref 8.9–10.3)
Chloride: 102 mmol/L (ref 98–111)
Creatinine, Ser: 0.93 mg/dL (ref 0.44–1.00)
GFR, Estimated: >60 mL/min (ref >60)
Glucose, Bld: 122 mg/dL — ABNORMAL HIGH (ref 70–99)
Potassium: 3.3 mmol/L — ABNORMAL LOW (ref 3.5–5.1)
Sodium: 141 mmol/L (ref 135–145)
Total Bilirubin: 0.5 mg/dL (ref 0.0–1.2)
Total Protein: 8 g/dL (ref 6.5–8.1)

## 2024-01-22 LAB — LIPASE, BLOOD: Lipase: 21 U/L (ref 11–51)

## 2024-01-22 LAB — HCG, SERUM, QUALITATIVE: Preg, Serum: NEGATIVE

## 2024-01-22 MED ORDER — MORPHINE SULFATE (PF) 4 MG/ML IV SOLN
4.0000 mg | Freq: Once | INTRAVENOUS | Status: AC
  Administered 2024-01-22: 4 mg via INTRAVENOUS
  Filled 2024-01-22: qty 1

## 2024-01-22 MED ORDER — MORPHINE SULFATE 15 MG PO TABS: 7.5000 mg | ORAL_TABLET | ORAL | 0 refills | Status: AC | PRN

## 2024-01-22 MED ORDER — SODIUM CHLORIDE 0.9 % IV BOLUS
1000.0000 mL | Freq: Once | INTRAVENOUS | Status: AC
  Administered 2024-01-22: 1000 mL via INTRAVENOUS

## 2024-01-22 MED ORDER — ONDANSETRON 4 MG PO TBDP: ORAL_TABLET | ORAL | 0 refills | Status: AC

## 2024-01-22 MED ORDER — ONDANSETRON HCL 4 MG/2ML IJ SOLN
4.0000 mg | Freq: Once | INTRAMUSCULAR | Status: AC
  Administered 2024-01-22: 4 mg via INTRAVENOUS
  Filled 2024-01-22: qty 2

## 2024-01-22 MED ORDER — TAMSULOSIN HCL 0.4 MG PO CAPS: 0.4000 mg | ORAL_CAPSULE | Freq: Every day | ORAL | 0 refills | Status: AC

## 2024-01-22 MED ORDER — KETOROLAC TROMETHAMINE 15 MG/ML IJ SOLN
15.0000 mg | Freq: Once | INTRAMUSCULAR | Status: AC
  Administered 2024-01-22: 15 mg via INTRAVENOUS
  Filled 2024-01-22: qty 1

## 2024-01-22 MED ORDER — OXYCODONE HCL 5 MG PO TABS
5.0000 mg | ORAL_TABLET | Freq: Once | ORAL | Status: AC
Start: 2024-01-22 — End: 2024-01-22
  Administered 2024-01-22: 5 mg via ORAL
  Filled 2024-01-22: qty 1

## 2024-01-22 MED ORDER — ACETAMINOPHEN 500 MG PO TABS
1000.0000 mg | ORAL_TABLET | Freq: Once | ORAL | Status: AC
  Administered 2024-01-22: 1000 mg via ORAL
  Filled 2024-01-22: qty 2

## 2024-01-22 NOTE — ED Notes (Signed)
Provided pt with ice water per MD.  

## 2024-01-22 NOTE — Discharge Instructions (Signed)
 Please follow-up with urologist in clinic.  Call them in the next working day and schedule an appointment.  Please return for uncontrolled pain fever.  Take 4 over the counter ibuprofen  tablets 3 times a day or 2 over-the-counter naproxen tablets twice a day for pain. Also take tylenol  1000mg (2 extra strength) four times a day.   Then take the pain medicine if you feel like you need it. Narcotics do not help with the pain, they only make you care about it less.  You can become addicted to this, people may break into your house to steal it.  It will constipate you.  If you drive under the influence of this medicine you can get a DUI.

## 2024-01-22 NOTE — ED Triage Notes (Addendum)
  Patient comes in with R sided flank pain that woke her up from sleep around midnight.  Patient states the pain felt like a cramping pain that wrapped around.  Thought it was gas and took gas-x before coming in.  Has urge to void but states it wont come out.  Has nausea with no emesis.  Pain 8/10, sharp/sore.  Took 500 mg naproxen before arrival.

## 2024-01-22 NOTE — ED Provider Notes (Signed)
 [place] EMERGENCY DEPARTMENT AT [facility] Provider Note   CSN: [id number] Arrival date & time: 01/22/24  9671     Patient presents with: Flank Pain and Nausea   Yesenia Dawson is a 28 y.o. female.   28 yo F with a chief complaint of abdominal pain.  This is right-sided going on for about 4 hours.  Nothing seems to make it better or worse.  Has an urgency to urinate but denies dysuria increased frequency or hesitancy.  Denies fevers.  Was fine yesterday.   Flank Pain       Prior to Admission medications   Medication Sig Start Date End Date Taking? Authorizing Provider  morphine  (MSIR) 15 MG tablet Take 0.5 tablets (7.5 mg total) by mouth every 4 (four) hours as needed for severe pain (pain score 7-10). 01/22/24  Yes Emil Share, DO  ondansetron  (ZOFRAN -ODT) 4 MG disintegrating tablet 4mg  ODT q4 hours prn nausea/vomit 01/22/24  Yes Payton Prinsen, DO  tamsulosin  (FLOMAX ) 0.4 MG CAPS capsule Take 1 capsule (0.4 mg total) by mouth daily after supper. 01/22/24  Yes Emil Share, DO  amphetamine-dextroamphetamine (ADDERALL) 20 MG tablet Take 20 mg by mouth 2 (two) times daily.    [provider]  busPIRone  (BUSPAR ) 10 MG tablet Take 2 tablets (20 mg total) by mouth daily as needed. Patient not taking: Reported on 12/17/2022 03/11/22     cetirizine  (ZYRTEC ) 10 MG tablet Take 1 tablet (10 mg total) by mouth daily. 06/18/23   Dreama, [place]  N, FNP  cyclobenzaprine  (FLEXERIL ) 5 MG tablet Take 1 tablet (5 mg total) by mouth 2 (two) times daily as needed for muscle spasms. Patient not taking: Reported on 12/17/2022 02/09/22   Horton, Charmaine FALCON, MD  dicyclomine (BENTYL) 10 MG capsule Take 10 mg by mouth. Patient not taking: Reported on 06/18/2023 12/25/22   [provider]  Doxylamine-Pyridoxine 10-10 MG TBEC     [provider]  Dulaglutide 1.5 MG/0.5ML SOAJ Inject 1.5 mg into the skin. Patient not taking: Reported on 06/18/2023 05/28/23   [provider]  ESTARYLLA 0.25-35 MG-MCG tablet Take 1 tablet by mouth daily.    [provider]  fluticasone  (FLONASE ) 50 MCG/ACT nasal spray Place 2 sprays into both nostrils daily. Patient not taking: Reported on 12/17/2022 03/22/21   Joesph Shaver Scales, PA-C  guaifenesin  (HUMIBID E) 400 MG TABS tablet Take 1 tablet 3 times daily as needed for chest congestion and cough Patient not taking: Reported on 12/17/2022 03/22/21   Joesph Shaver Scales, PA-C  ibuprofen  (ADVIL ) 600 MG tablet Take 1 tablet (600 mg total) by mouth every 8 (eight) hours as needed for up to 30 doses for fever, headache, mild pain or moderate pain (Inflammation). Take 1 tablet 3 times daily as needed for inflammation of upper airways and/or pain. Patient not taking: Reported on 12/17/2022 03/22/21   Joesph Shaver Scales, PA-C  ibuprofen  (ADVIL ) 600 MG tablet Take 1 tablet (600 mg total) by mouth every 6 (six) hours as needed. Patient not taking: Reported on 12/17/2022 02/09/22   Horton, Charmaine FALCON, MD  nitrofurantoin , macrocrystal-monohydrate, (MACROBID ) 100 MG capsule Take 100 mg by mouth 2 (two) times daily. Patient not taking: Reported on 06/18/2023 03/05/23   [provider]  olopatadine  (PATADAY ) 0.1 % ophthalmic solution Place 1 drop into both eyes 2 (two) times daily as needed for allergies. 06/18/23   Dreama, [place]  N, FNP  phenazopyridine (PYRIDIUM) 200 MG tablet Take 200 mg by mouth 2 (two) times daily.  Patient not taking: Reported on 06/18/2023 03/05/23   [provider]  Prenatal Vit-Fe Phos-FA-Omega (VITAFOL GUMMIES) 3.33-0.333-34.8 MG CHEW Chew 3 each by mouth daily. Patient not taking: Reported on 12/17/2022    [provider]  rizatriptan (MAXALT) 10 MG tablet Take 10 mg by mouth. Patient not taking: Reported on 06/18/2023 10/22/22   [provider]  tirzepatide [facility]) 7.5 MG/0.5ML Pen Inject 7.5 mg into the skin. 04/18/23   [provider]  VENTOLIN HFA  108 (90 Base) MCG/ACT inhaler Inhale 2 puffs into the lungs every 4 (four) hours as needed.    [provider]  Vitamin D, Ergocalciferol, (DRISDOL) 1.25 MG (50000 UNIT) CAPS capsule Take 50,000 Units by mouth. Patient not taking: Reported on 06/18/2023 09/25/22   [provider]    Allergies: Buspirone  and Metformin and related    Review of Systems  Genitourinary:  Positive for flank pain.    Updated Vital Signs BP (!) 128/92   Pulse (!) 103   Temp 97.9 F (36.6 C) (Oral)   Resp (!) 22   Ht 5' (1.524 m)   Wt 88.5 kg   LMP 12/24/2023 (Approximate)   SpO2 100%   BMI 38.08 kg/m   Physical Exam Vitals and nursing note reviewed.  Constitutional:      General: She is not in acute distress.    Appearance: She is well-developed. She is not diaphoretic.  HENT:     Head: Normocephalic and atraumatic.  Eyes:     Pupils: Pupils are equal, round, and reactive to light.  Cardiovascular:     Rate and Rhythm: Normal rate and regular rhythm.     Heart sounds: No murmur heard.    No friction rub. No gallop.  Pulmonary:     Effort: Pulmonary effort is normal.     Breath sounds: No wheezing or rales.  Abdominal:     General: There is no distension.     Palpations: Abdomen is soft.     Tenderness: There is no abdominal tenderness.     Comments: Diffuse abdominal discomfort without obvious focality  Musculoskeletal:        General: No tenderness.     Cervical back: Normal range of motion and neck supple.  Skin:    General: Skin is warm and dry.  Neurological:     Mental Status: She is alert and oriented to person, place, and time.  Psychiatric:        Behavior: Behavior normal.     (all labs ordered are listed, but only abnormal results are displayed) Labs Reviewed  CBC WITH DIFFERENTIAL/PLATELET - Abnormal; Notable for the following components:      Result Value   WBC 11.9 (*)    Neutro Abs 9.3 (*)    All other components within normal limits  COMPREHENSIVE  METABOLIC PANEL WITH GFR - Abnormal; Notable for the following components:   Potassium 3.3 (*)    Glucose, Bld 122 (*)    All other components within normal limits  URINALYSIS, ROUTINE W REFLEX MICROSCOPIC - Abnormal; Notable for the following components:   Hgb urine dipstick TRACE (*)    Protein, ur TRACE (*)    Leukocytes,Ua TRACE (*)    Bacteria, UA RARE (*)    All other components within normal limits  LIPASE, BLOOD  HCG, SERUM, QUALITATIVE    EKG: None  Radiology: CT Renal Stone Study Result Date: 01/22/2024 EXAM: CT UROGRAM 01/22/2024 05:30:33 AM TECHNIQUE: CT of the abdomen and pelvis  was performed without the administration of intravenous contrast as per CT urogram protocol. Multiplanar reformatted images as well as MIP urogram images are provided for review. Automated exposure control, iterative reconstruction, and/or weight based adjustment of the mA/kV was utilized to reduce the radiation dose to as low as reasonably achievable. COMPARISON: None available. CLINICAL HISTORY: Acute right flank pain. FINDINGS: LOWER CHEST: No acute abnormality. LIVER: The liver is unremarkable. GALLBLADDER AND BILE DUCTS: Gallbladder is unremarkable. No biliary ductal dilatation. SPLEEN: No acute abnormality. PANCREAS: No acute abnormality. ADRENAL GLANDS: No acute abnormality. KIDNEYS, URETERS AND BLADDER: There is diffuse right renal swelling and mild hydroureteronephrosis due to a 2 mm calculus in the distal right ureter near the ureterovesical junction (UVJ). No stones are seen in the left kidney or ureter. The left kidney and ureter are unremarkable. Urinary bladder is unremarkable. GI AND BOWEL: Stomach demonstrates no acute abnormality. There is no bowel obstruction. PERITONEUM AND RETROPERITONEUM: No ascites. No free air. VASCULATURE: Aorta is normal in caliber. LYMPH NODES: No lymphadenopathy. REPRODUCTIVE ORGANS: No acute abnormality. BONES AND SOFT TISSUES: No acute osseous abnormality. No  focal soft tissue abnormality. IMPRESSION: 1. 2 mm distal right ureteral calculus near the UVJ causing mild hydroureteronephrosis and diffuse right renal swelling. Electronically signed by: Norleen Kil MD 01/22/2024 05:37 AM EST RP Workstation: [id number]     Procedures   Medications Ordered in the ED  acetaminophen  (TYLENOL ) tablet 1,000 mg (has no administration in time range)  oxyCODONE  (Oxy IR/ROXICODONE ) immediate release tablet 5 mg (has no administration in time range)  morphine  (PF) 4 MG/ML injection 4 mg (4 mg Intravenous Given 01/22/24 0439)  ondansetron  (ZOFRAN ) injection 4 mg (4 mg Intravenous Given 01/22/24 0439)  sodium chloride  0.9 % bolus 1,000 mL (0 mLs Intravenous Stopped 01/22/24 9367)  morphine  (PF) 4 MG/ML injection 4 mg (4 mg Intravenous Given 01/22/24 0503)  ketorolac  (TORADOL ) 15 MG/ML injection 15 mg (15 mg Intravenous Given 01/22/24 0520)                                    Medical Decision Making Amount and/or Complexity of Data Reviewed Labs: ordered. Radiology: ordered.  Risk OTC drugs. Prescription drug management.   28 yo F with a chief complaint of abdominal and side pain.  Going on for about 4 hours now.  Patient presenting most commonly as a kidney stone.  Will treat symptoms.  CT imaging.  Reassess.  CT with a right UVJ stone 2 mm.  UA without obvious infection.  Mild leukocytosis.  LFTs and lipase unremarkable.  Pregnancy test negative.  Will discharge the patient home.  Urology follow-up.  6:46 AM:  I have discussed the diagnosis/risks/treatment options with the patient and family.  Evaluation and diagnostic testing in the emergency department does not suggest an emergent condition requiring admission or immediate intervention beyond what has been performed at this time.  They will follow up with Urology, PCP. We also discussed returning to the ED immediately if new or worsening sx occur. We discussed the sx which are most concerning (e.g.,  sudden worsening pain, fever, inability to tolerate by mouth) that necessitate immediate return. Medications administered to the patient during their visit and any new prescriptions provided to the patient are listed below.  Medications given during this visit Medications  acetaminophen  (TYLENOL ) tablet 1,000 mg (has no administration in time range)  oxyCODONE  (Oxy IR/ROXICODONE ) immediate release tablet 5 mg (has  no administration in time range)  morphine  (PF) 4 MG/ML injection 4 mg (4 mg Intravenous Given 01/22/24 0439)  ondansetron  (ZOFRAN ) injection 4 mg (4 mg Intravenous Given 01/22/24 0439)  sodium chloride  0.9 % bolus 1,000 mL (0 mLs Intravenous Stopped 01/22/24 9367)  morphine  (PF) 4 MG/ML injection 4 mg (4 mg Intravenous Given 01/22/24 0503)  ketorolac  (TORADOL ) 15 MG/ML injection 15 mg (15 mg Intravenous Given 01/22/24 0520)     The patient appears reasonably screen and/or stabilized for discharge and I doubt any other medical condition or other [facility] requiring further screening, evaluation, or treatment in the ED at this time prior to discharge.       Final diagnoses:  Ureterolithiasis    ED Discharge Orders          Ordered    morphine  (MSIR) 15 MG tablet  Every 4 hours PRN        01/22/24 0610    ondansetron  (ZOFRAN -ODT) 4 MG disintegrating tablet        01/22/24 0610    tamsulosin  (FLOMAX ) 0.4 MG CAPS capsule  Daily after supper        01/22/24 0610               Emil Share, DO 01/22/24 [id number]

## 2024-06-22 ENCOUNTER — Ambulatory Visit: Admitting: Dermatology
